# Patient Record
Sex: Female | Born: 1965 | Race: White | Hispanic: No | Marital: Married | Smoking: Former smoker
Health system: Southern US, Community
[De-identification: ages and names within clinical notes are randomized; demographics above are authoritative.]

## PROBLEM LIST (undated history)

## (undated) ENCOUNTER — Ambulatory Visit: Admission: EM | Payer: 59 | Source: Home / Self Care

## (undated) DIAGNOSIS — T7840XA Allergy, unspecified, initial encounter: Secondary | ICD-10-CM

## (undated) DIAGNOSIS — E0590 Thyrotoxicosis, unspecified without thyrotoxic crisis or storm: Secondary | ICD-10-CM

## (undated) DIAGNOSIS — E079 Disorder of thyroid, unspecified: Secondary | ICD-10-CM

## (undated) DIAGNOSIS — R079 Chest pain, unspecified: Secondary | ICD-10-CM

## (undated) DIAGNOSIS — M1990 Unspecified osteoarthritis, unspecified site: Secondary | ICD-10-CM

## (undated) DIAGNOSIS — G90A Postural orthostatic tachycardia syndrome (POTS): Secondary | ICD-10-CM

## (undated) DIAGNOSIS — Z720 Tobacco use: Secondary | ICD-10-CM

## (undated) DIAGNOSIS — Z973 Presence of spectacles and contact lenses: Secondary | ICD-10-CM

## (undated) DIAGNOSIS — R609 Edema, unspecified: Secondary | ICD-10-CM

## (undated) DIAGNOSIS — R000 Tachycardia, unspecified: Secondary | ICD-10-CM

## (undated) DIAGNOSIS — I498 Other specified cardiac arrhythmias: Secondary | ICD-10-CM

## (undated) DIAGNOSIS — I951 Orthostatic hypotension: Secondary | ICD-10-CM

## (undated) DIAGNOSIS — E785 Hyperlipidemia, unspecified: Secondary | ICD-10-CM

## (undated) HISTORY — PX: KNEE ARTHROPLASTY: SHX992

## (undated) HISTORY — PX: THYROID SURGERY: SHX805

## (undated) HISTORY — DX: Unspecified osteoarthritis, unspecified site: M19.90

## (undated) HISTORY — DX: Allergy, unspecified, initial encounter: T78.40XA

## (undated) HISTORY — DX: Tobacco use: Z72.0

## (undated) HISTORY — DX: Chest pain, unspecified: R07.9

## (undated) HISTORY — DX: Edema, unspecified: R60.9

## (undated) HISTORY — DX: Hyperlipidemia, unspecified: E78.5

---
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
## 2003-11-13 ENCOUNTER — Ambulatory Visit (HOSPITAL_COMMUNITY): Admission: RE | Admit: 2003-11-13 | Discharge: 2003-11-13 | Payer: Self-pay | Admitting: *Deleted

## 2004-10-15 HISTORY — PX: THYROIDECTOMY: SHX17

## 2005-06-08 ENCOUNTER — Ambulatory Visit: Payer: Self-pay | Admitting: Cardiology

## 2005-06-15 ENCOUNTER — Ambulatory Visit: Payer: Self-pay | Admitting: Cardiology

## 2005-10-15 HISTORY — PX: TILT TABLE STUDY: SHX6124

## 2006-02-20 ENCOUNTER — Encounter: Admission: RE | Admit: 2006-02-20 | Discharge: 2006-02-20 | Payer: Self-pay | Admitting: Neurology

## 2006-02-22 ENCOUNTER — Ambulatory Visit: Payer: Self-pay | Admitting: Internal Medicine

## 2006-03-07 ENCOUNTER — Ambulatory Visit (HOSPITAL_COMMUNITY): Admission: RE | Admit: 2006-03-07 | Discharge: 2006-03-07 | Payer: Self-pay | Admitting: Internal Medicine

## 2006-03-13 ENCOUNTER — Ambulatory Visit: Payer: Self-pay | Admitting: Internal Medicine

## 2006-04-12 ENCOUNTER — Ambulatory Visit: Payer: Self-pay | Admitting: Internal Medicine

## 2006-04-19 ENCOUNTER — Ambulatory Visit: Payer: Self-pay | Admitting: Internal Medicine

## 2006-04-29 ENCOUNTER — Ambulatory Visit: Payer: Self-pay | Admitting: Internal Medicine

## 2006-07-15 ENCOUNTER — Ambulatory Visit: Payer: Self-pay | Admitting: Internal Medicine

## 2006-09-12 ENCOUNTER — Ambulatory Visit: Payer: Self-pay | Admitting: Internal Medicine

## 2007-01-27 ENCOUNTER — Ambulatory Visit: Payer: Self-pay | Admitting: Internal Medicine

## 2007-03-27 ENCOUNTER — Ambulatory Visit: Payer: Self-pay | Admitting: Internal Medicine

## 2007-07-04 ENCOUNTER — Ambulatory Visit: Payer: Self-pay | Admitting: Internal Medicine

## 2008-02-06 ENCOUNTER — Ambulatory Visit: Payer: Self-pay | Admitting: Internal Medicine

## 2008-10-29 ENCOUNTER — Ambulatory Visit: Payer: Self-pay | Admitting: Internal Medicine

## 2008-10-29 DIAGNOSIS — I951 Orthostatic hypotension: Secondary | ICD-10-CM | POA: Insufficient documentation

## 2008-10-29 HISTORY — DX: Orthostatic hypotension: I95.1

## 2009-05-16 ENCOUNTER — Ambulatory Visit: Payer: Self-pay | Admitting: Internal Medicine

## 2009-11-08 ENCOUNTER — Encounter (INDEPENDENT_AMBULATORY_CARE_PROVIDER_SITE_OTHER): Payer: Self-pay | Admitting: *Deleted

## 2010-01-09 ENCOUNTER — Ambulatory Visit: Payer: Self-pay | Admitting: Internal Medicine

## 2010-02-21 ENCOUNTER — Ambulatory Visit (HOSPITAL_COMMUNITY): Admission: RE | Admit: 2010-02-21 | Discharge: 2010-02-21 | Payer: Self-pay | Admitting: Orthopaedic Surgery
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
## 2010-11-14 NOTE — Letter (Signed)
Summary: Appointment - Reminder 2  [facility], Main Office  [address]   [place], [place] [address]   Phone: [phone]  Fax: [phone]     November 08, 2009 MRN: [id number]   [name] [address] [place], [place]  [address]   Dear Ms. Leichter,  Our records indicate that it is time to schedule a follow-up appointment with Dr. Tenny Craw. It is very important that we reach you to schedule this appointment. We look forward to participating in your health care needs. Please contact us at the number listed above at your earliest convenience to schedule your appointment.  If you are unable to make an appointment at this time, give [place] a call so we can update our records.  Sincerely,   Migdalia Dk [facility] Scheduling Team

## 2011-02-27 NOTE — Assessment & Plan Note (Signed)
[facility]                       [place] CARDIOLOGY OFFICE NOTE   NAME:[name], [name]                         MRN:          [id number]  DATE:07/04/2007                            DOB:          05-05-1966    IDENTIFICATION:  [name] is a 45 year old woman with a history of POTS.  I last saw her back in June.   Since seen, she has done well. She is off Florinef. She remains only on  Metoprolol 25 mg daily and Wellbutrin 1 time per day (she dropped down  from b.i.d. in early September). She denies dizziness. She is active.  There is no chest pressure.   CURRENT MEDICATIONS:  1. Wellbutrin 150 mg daily.  2. Toprol XL 25 mg daily (question Metoprolol).   PHYSICAL EXAMINATION:  GENERAL:  The patient is in no distress.  VITAL SIGNS:  Blood pressure 108/70, pulse 68 and regular, weight 155  down from 163 on last visit.  LUNGS:  Clear.  NECK:  JVP is normal.  CARDIAC:  Regular rate and rhythm, S1, S2, no S3, no murmurs or clicks.  ABDOMEN:  Benign.  EXTREMITIES:  No edema.   LABORATORY DATA:  When I last saw her in June: Total cholesterol 117,  triglycerides 40, LDL 66, HDL 43.   IMPRESSION:  Dysautonomia. The patient is doing very well on a tapering  phase from her medicines. I am reluctant and I think that right now that  I would leave her on the Metoprolol and the Wellbutrin. I would not  taper any faster. I would like to see her back in the spring.   She is interested in starting exercise and I think that this is good.  Again, take activities as tolerated, increasing slowly, and drink plenty  of fluids. I will set to see her back again as noted in the spring,  sooner if problems develop.     Pricilla Riffle, MD, [facility]  Electronically Signed    PVR/MedQ  DD: 07/04/2007  DT: 07/04/2007  Job #: [id number]   cc:   Samuel Jester

## 2011-02-27 NOTE — Assessment & Plan Note (Signed)
[facility]                       [place] CARDIOLOGY OFFICE NOTE   NAME:Sand, [name]                         MRN:          [id number]  DATE:03/27/2007                            DOB:          1965/11/18    IDENTIFICATION:  The patient is a 45 year old woman with a history of  POTTS. Last seen in cardiology clinic back in [place].   Since seen, she has cut back her Florinef to a quarter tablet daily and  continues on the Wellbutrin and the Toprol. She is doing well. She has  been active with work. She went to [employer] for three days  and has been out in the 100 degree heat. She admits that she is drinking  a lot of liquids.   She notes rare dizziness. Otherwise, doing well.   CURRENT MEDICATIONS:  1. Synthroid 0.1.  2. Wellbutrin XL 150 b.i.d.  3. Metoprolol 25 daily.  4. Florinef one-quarter of 0.1 mg daily.   PHYSICAL EXAMINATION:  The patient is in no distress. Blood pressure is  100/70, pulse 64, weight is 163.  LUNGS:  Clear.  CARDIAC: Regular rate and rhythm, S1, S2. No S3.  ABDOMEN: Benign.  EXTREMITIES: No edema.   Lipids pending. Note, LFT shows slight minimal decrease in her albumin  at 3.4, normal 3.5.   IMPRESSION:  POTTS, clinically improved. I told her to stop the Florinef  and if by September she is still feeling okay she can go back down on  the Wellbutrin to 150 daily. I would like to see her in December. I am  not eager to taper any faster as I am worried she will have some  rebound. Again, she seems to be recovering from this episode well.  Encouraged her to stay adequately hydrated.     Pricilla Riffle, MD, [facility]  Electronically Signed    PVR/MedQ  DD: 03/27/2007  DT: 03/27/2007  Job #: [id number]   cc:   Samuel Jester

## 2011-02-27 NOTE — Assessment & Plan Note (Signed)
[facility]                            CARDIOLOGY OFFICE NOTE   NAME:Markwardt, TRISTEN PENNINO                         MRN:          [id number]  DATE:10/29/2008                            DOB:          [birth year]    IDENTIFICATION:  Ms. Mis is a 45 year old woman with a history of POTS.  I saw her back in Jaquelyn 2009.   Since seen, she has done well.  She stays active.  She denies dizziness  occasionally at the end of a busy day.  She will see kind of spots in  her visual field, but they recover on her own.   CURRENT MEDICATIONS:  1. Wellbutrin SR 150 every other day.  2. Metoprolol tartrate 25 daily.  3. Synthroid 0.1.   PHYSICAL EXAMINATION:  GENERAL:  On exam, the patient is in no distress  at rest.  VITAL SIGNS:  Blood pressure 110/66, pulse 62 and regular, weight 170,  up from 165.  NECK:  No JVD.  LUNGS:  Clear.  CARDIAC:  Regular rate and rhythm, S1 and S2.  No S3, no murmurs, no  clicks.  ABDOMEN:  Benign.  EXTREMITIES:  No edema.   IMPRESSION:  1. Dysautonomia continues to improve.  We have discussed back in      fourth, I think, she could come off the Wellbutrin.  If she has any      problems, I would put her on lower dose 37.5 daily.  I would      continue the beta-blocker, encouraged her to stay active,      encouraged her to drink fluids.  I will set to see her in the      summer.   ADDENDUM:  A 12-lead EKG, normal sinus rhythm 61 beats per minute.     Pricilla Riffle, MD, [facility]  Electronically Signed    PVR/MedQ  DD: 10/29/2008  DT: 10/29/2008  Job #: [id number]

## 2011-02-27 NOTE — Assessment & Plan Note (Signed)
[facility]                       [place] CARDIOLOGY OFFICE NOTE   NAME:Saldarriaga, LAILANA SHIRA                         MRN:          [id number]  DATE:02/06/2008                            DOB:          [birth year]    IDENTIFICATION:  Ms. Caloca is a  45 year old woman with a history of  POTS.  I last saw her back in June.   In the interval she continues to do well.  She denies any palpitations.  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  She notes rare times where she feels like her heart might start going  fast, but it never happens.   CURRENT MEDICINES:  1. Include Synthroid 0.1.  2. Wellbutrin 150 daily.  3. Metoprolol XL 25 daily.   PHYSICAL EXAM:  The patient is in no distress.  Blood pressure is 116/72, pulse is 60, weight 165.  Her lungs are clear.  Cardiac exam, regular rate and rhythm, S1-S2 no S3.  ABDOMEN:  Benign.  EXTREMITIES:  No edema.   IMPRESSION:  Dysautonomia improved with really no symptoms.  I would go  ahead and back off on the Wellbutrin to every other day and then every 3  days, continue on the Toprol.  At some point will start tapering more.  I would like to see her back in the fall.  If she has any recurrence of  symptoms on the taper, of course, she should call and go back on what  she is on.  I would like to see her back in September.     Pricilla Riffle, MD, [facility]  Electronically Signed    PVR/MedQ  DD: 02/06/2008  DT: 02/06/2008  Job #: [id number]

## 2011-03-02 NOTE — Assessment & Plan Note (Signed)
[facility]                              CARDIOLOGY OFFICE NOTE   NAME:Pagel, ABIA MONACO                         MRN:          [id number]  DATE:04/29/2006                            DOB:          [birth year]    IDENTIFICATION:  Patient is a 45 year old woman with a history of POTS.  I  last saw her in clinic back in June on the 29th.   When I saw her last I increased her Florinef to 0.1 mg q. a.m.,  0.05 mg q.  p.m.   In the interval she says she is feeling better.  She only has occasional  episodes where she gets foggy in her eyes.  She has had no significant  spells though.  She is increasing her activity without problem.   CURRENT MEDICATIONS:  1.  Synthroid 0.1 mg q. day.  2.  Wellbutrin 150 b.i.d.  3.  Florinef 0.1 mg a.m., 0.05 mg q. p.m.  4.  Toprol 25 mg q. day.   PHYSICAL EXAMINATION:  GENERAL:  Patient is in no distress.  VITAL SIGNS:  Blood pressure is 123/83 laying.  Pulse 71.  Sitting 116/80.  Pulse 74.  Standing at one minute 129/80.  Pulse 81.  Standing at two  minutes 126/87.  Pulse 74.  Standing at five minutes 125/86.  Pulse 74.  LUNGS:  Clear.  CARDIAC:  Regular rate and rhythm.  S1, S2, no S3.  No murmurs.  ABDOMEN:  Benign.   IMPRESSION:  1.  Dysautonomia .  Has improved on the current regimen of Florinef,      Wellbutrin and Toprol.  Note recent potassium was within normal limits.      Checked the week after increased dose of Florinef.  2.  I told the patient to increase activities as tolerated.  She would like      to try work, I told her to do this slowly, otherwise again adequate      fluid intake.  3.  I will see her back in two months time.                                Pricilla Riffle, MD, [facility]    PVR/MedQ  DD:  04/29/2006  DT:  04/30/2006  Job #:  [id number]   cc:   Genene Churn. Love, MD

## 2011-03-02 NOTE — Assessment & Plan Note (Signed)
[facility]                            CARDIOLOGY OFFICE NOTE   NAME:Audrey, HARIKA Boyd                         MRN:          [id number]  DATE:01/27/2007                            DOB:          [birth year]    IDENTIFICATION:  Ms. [name] is a 45 year old woman with a history of POTS.  She was last seen in clinic back in November.   In the interval, she says she continues to do better.  She is working  and notes only real dizziness when she sits down.  She is drinking a lot  of caffeine, she says a lot of [employer].  She denies palpitations.   CURRENT MEDICATIONS:  1. Synthroid 0.1 daily.  2. Wellbutrin 150 b.i.d.  3. Metoprolol 25 daily.  4. Florinef 0.05 daily.   PHYSICAL EXAMINATION:  GENERAL:  The patient is in no distress.  VITAL SIGNS:  Blood pressure is laying 120/75, pulse 68; sitting 126/85,  pulse 68; standing 114/78, pulse 78; standing at 2 minutes, 126/80,  pulse 84; standing at 5 minutes, 110/76, pulse 76.  The patient got a  little dizzy after sitting down.  LUNGS:  Clear.  CARDIAC:  Regular rate and rhythm, S1, S2, no S3.  ABDOMEN:  Benign.  EXTREMITIES:  No edema.   IMPRESSION/PLAN:  Akshita is a 45 year old with postural tachycardia  syndrome (POTS) and doing better.  She still has some increase in her  heart rate.  I told her though to try and back down the Florinef to a  1/4 tablet daily.  Continue on the other medicines.  She should do this  x2 months.  I think for now though, I would even keep her on it and I  will see her back in August.  If she has any problems, call sooner.  Again, I talked to her and told her to cut back on the caffeine intake  as this is a diuretic.  Encouraged her to maintain salt intake.     Pricilla Riffle, MD, [facility]  Electronically Signed    PVR/MedQ  DD: 01/27/2007  DT: 01/28/2007  Job #: [id number]

## 2011-03-02 NOTE — Assessment & Plan Note (Signed)
[facility]                            CARDIOLOGY OFFICE NOTE   NAME:Akers, ELLIA KNOWLTON                         MRN:          [id number]  DATE:09/12/2006                            DOB:          04-04-1966    IDENTIFICATION:  Mrs. Caul is a 45 year old woman with history of POTTS  LSR back in early October.   When I saw her last, I told her to back down on her Florinef to a half  daily.  She has done this.  She is also on the Toprol-XL 25 mg daily as  well as Wellbutrin 150 mg b.i.d.   She notes she is feeling well.  She is back to work some.  She denies  significant dizziness.  She is suffering from URI at present and notes  yellow sputum.   CURRENT MEDICATIONS:  1. Synthroid 0.1 daily.  2. Wellbutrin 150 mg b.i.d.  3. Metoprolol 25 mg daily.  4. Nasonex.  5. Florinef 0.5 daily.   PHYSICAL EXAMINATION:  GENERAL:  The patient is in no acute distress.  VITAL SIGNS:  Blood pressure lying 107/70, pulse 81; sitting 106/74,  pulse 80; standing at 0 minutes 106/74, pulse 92; at two minutes 105/77,  pulse 94; at five minutes, 112/76, pulse 95.  LUNGS:  Clear.  No rales or wheezes.  CARDIAC:  Regular rate and rhythm.  S1 and S2.  No S3.  No murmurs.  ABDOMEN:  Benign.  Obese.  EXTREMITIES:  No edema.   IMPRESSION:  1. Autonomic dysfunction.  Doing better.  Actually no evidence of      orthostasis on exam.  I would though continue him on the current      regimen for now. I am reluctant to back her off since she is just      now beginning to feel well.  Will reassess in several months.  2. Upper respiratory infection.  I  have given her a prescription for      a Z-pack.  With her autonomic dysfunction, if things get worse, she      would not tolerate well.     Pricilla Riffle, MD, [facility]  Electronically Signed    PVR/MedQ  DD: 09/12/2006  DT: 09/13/2006  Job #: [id number]

## 2011-03-02 NOTE — Op Note (Signed)
NAMEENES, WEGENER NO.:  [id number]   MEDICAL RECORD NO.:  [id number]          PATIENT TYPE:  OIB   LOCATION:  2855                         FACILITY:  [facility]   PHYSICIAN:  Duke Salvia, M.D.  DATE OF BIRTH:  1966-08-27   DATE OF PROCEDURE:  DATE OF DISCHARGE:  03/07/2006                                 OPERATIVE REPORT   PREOPERATIVE DIAGNOSIS:  Spells.   POSTOPERATIVE DIAGNOSIS:  Inconclusive tilt table test.   PROCEDURE:  Tilt table testing.   On obtaining informed consent, the patient was brought to the  electrophysiology laboratory, placed on a tilting table in __________ supine  position.  The patient was then tilted up at 70 degrees.  There was a  gradual decrease in blood pressure from a baseline of 140 or so/85 to  113s/82, at which time the patient complained of passing out, and she was  returned to the supine position.  Repeat blood pressure was not obtained.  The heart rate had climbed gradually from the resting rate of 78 into the  mid 80s and with her complaints fell to the mid 50s and 60s.   IMPRESSION:  1.  Modest orthostasis with superimposition of a bradycardic reaction,      possibly cardioinhibitory.           ______________________________  Duke Salvia, M.D.     SCK/MEDQ  D:  04/19/2006  T:  04/19/2006  Job:  [id number]

## 2011-03-02 NOTE — Discharge Summary (Signed)
NAMECANDI, PROFIT NO.:  [id number]   MEDICAL RECORD NO.:  [id number]           PATIENT TYPE:   LOCATION:                                 FACILITY:   PHYSICIAN:  Maple Mirza, P.A.      DATE OF BIRTH:   DATE OF ADMISSION:  02/23/2006  DATE OF DISCHARGE:  03/07/2006                                 DISCHARGE SUMMARY   ALLERGIES:  1.  PENICILLIN.  2.  LATEX.   PRINCIPAL DIAGNOSES:  1.  Positive tilt study.      1.  Symptomatic 17 minutes after tilt to 70 degrees.      2.  Presyncopal at heart rate of 64.  Blood pressure unable to be taken,          but 1 minute prior it was 113/82, down from 143/85 at the beginning          of the study.  2.  Palpitations/dizziness.  Her initial presentation was March 2006 with      hypertension.  3.  At tilt, my symptoms were exactly reproduced per patient.  4.  The patient had palpitations which were better on Toprol/__________ on      Pindolol.  5.  Consistent headache, on Florinef.  6.  Extensive neurologic work-up in the past.   SECONDARY DIAGNOSIS:  Status post a thyroidectomy for goiter and hot thyroid  nodule.   BRIEF HISTORY:  Ms. Maiorana is a 45 year old female who has been having spells  of palpitation and dizziness/presyncope.  Her first spell was back in March  of 2006.  She became dizzy, she felt he heart racing, she was taken to  [facility] by EMS.  Her blood pressure at that time was 200/180.  The  patient continues to have problems with dizziness and palpitations.  She was  placed on Toprol some months ago by Dr. Charm Barges and says her heart rate has  done better as far as palpitations somewhat racing.   Overall, she has episodes of dizziness that are worse in the evening when  she is tired.  She has some at work, and she has to be careful not to  actually pass out.  She has not had any frank syncope, and her blood  pressure apparently is labile.  She apparently gets dizzy if she stands for  prolonged periods.  Her palpitations last as long as 30 minutes to 1 hour.  Apparently, she wore a monitor, but had inconclusive results.  With  palpitations, she feels her heart either racing or beating very hard; she  cannot distinguish which that is.  Apparently, this is mostly at night.  She  also has alternating chills and hot flashes, and then she becomes over  dizzy, even when sitting down.  The patient reports having a headache after  being started on Florinef and Pindolol.  Dr. Graciela Husbands says that Florinef could  cause headache, and the patient could discontinue that if she wished as this  time.   The patient says that her symptoms do not coincide in any way  with her  menses.  There is some thought that maybe she could benefit from a cortisol  level and perhaps a more thorough endocrine work-up.   However, to bring things back into focus, the patient's tilt study today,  Mar 07, 2006, was abnormal.  It was positive, and she will follow up with  Dr. Tenny Craw for this.  Dr. Tenny Craw saw her in the office on Feb 22, 2006, and her  last  sentence holds some hope out to the patient.  It says that she will follow  up with Dr. Tenny Craw in a few weeks to see how she responds to pindolol and  Florinef and where to go from there.  Again, there are other medicines to  consider for heart rate stabilization if these [place].      Maple Mirza, P.A.     GM/MEDQ  D:  03/07/2006  T:  03/07/2006  Job:  [id number]   cc:   Pricilla Riffle, M.D.  [address]  [address]  [place]  [place] [address]   Genene Churn. Love, M.D.  Fax: [phone]

## 2011-03-02 NOTE — Assessment & Plan Note (Signed)
[facility]                              CARDIOLOGY OFFICE NOTE   NAME:Rimmer, SHERROL VICARS                         MRN:          [id number]  DATE:07/15/2006                            DOB:          07-Dec-1965    IDENTIFICATION:  The patient is a 45 year old with a history of POTS.  She  was last seen in clinic back in July.   Since seen, she actually has noted feeling a little foggier, feeling her  vision is just a little off, has not had any syncopal spells, has some  dizziness thinks it might be a little bit better.   CURRENT MEDICATIONS:  1. Wellbutrin XL 150 b.i.d.  2. Florinef 0.1 mg q.a.m., 0.05 mg q.p.m.  3. Toprol XL 25 every day.  4. Synthroid 0.1 every day.   PHYSICAL EXAMINATION:  GENERAL:  The patient is in no distress.  VITAL SIGNS:  Blood pressure, laying 137/86 and pulse 58; sitting 131/90 and  pulse 59; standing 137/89 and pulse 64, at two minutes 150/93 and pulse 66;  five minutes 148/and 101 pulse 68.  LUNGS:  Clear.  CARDIAC:  Regular rate rhythm.  Normal S1 S2.  No extra heart sounds.  ABDOMEN:  Benign.  EXTREMITIES:  No edema.  HEENT:  Ophthalmologic exam:  Disks are flat.  Normal vessels.   IMPRESSION:  Symptomatically, Ms. Sias is feeling a little foggier with  visual symptoms.  On exam today, she is a little hypertensive.   I have asked her to back down on her Florinef to 0.05 q.a.m., hold  tomorrow's dose.  I would like to see her back in a few months to see how  she is doing with this.  Again, encouraged her to stay active.  If her  symptoms worsen, of course contact us sooner.            ______________________________  Pricilla Riffle, MD, [facility]     PVR/MedQ  DD:  07/15/2006  DT:  07/16/2006  Job #:  [id number]

## 2012-05-28 ENCOUNTER — Emergency Department (HOSPITAL_COMMUNITY)
Admission: EM | Admit: 2012-05-28 | Discharge: 2012-05-28 | Disposition: A | Discharge status: Home / Self Care | Payer: Managed Care, Other (non HMO) | Attending: Emergency Medicine | Admitting: Emergency Medicine

## 2012-05-28 ENCOUNTER — Emergency Department (HOSPITAL_COMMUNITY): Payer: Managed Care, Other (non HMO)

## 2012-05-28 ENCOUNTER — Encounter (HOSPITAL_COMMUNITY): Payer: Self-pay | Admitting: Emergency Medicine

## 2012-05-28 DIAGNOSIS — E079 Disorder of thyroid, unspecified: Secondary | ICD-10-CM | POA: Insufficient documentation

## 2012-05-28 DIAGNOSIS — W230XXA Caught, crushed, jammed, or pinched between moving objects, initial encounter: Secondary | ICD-10-CM | POA: Insufficient documentation

## 2012-05-28 DIAGNOSIS — Z880 Allergy status to penicillin: Secondary | ICD-10-CM | POA: Insufficient documentation

## 2012-05-28 DIAGNOSIS — S61209A Unspecified open wound of unspecified finger without damage to nail, initial encounter: Secondary | ICD-10-CM | POA: Insufficient documentation

## 2012-05-28 DIAGNOSIS — Z23 Encounter for immunization: Secondary | ICD-10-CM | POA: Insufficient documentation

## 2012-05-28 DIAGNOSIS — S61219A Laceration without foreign body of unspecified finger without damage to nail, initial encounter: Secondary | ICD-10-CM

## 2012-05-28 HISTORY — DX: Disorder of thyroid, unspecified: E07.9

## 2012-05-28 MED ORDER — LIDOCAINE HCL (PF) 1 % IJ SOLN
INTRAMUSCULAR | Status: AC
  Administered 2012-05-28: 19:00:00
  Filled 2012-05-28: qty 5

## 2012-05-28 MED ORDER — SULFAMETHOXAZOLE-TRIMETHOPRIM 800-160 MG PO TABS: 1.0000 | ORAL_TABLET | Freq: Two times a day (BID) | ORAL | Status: AC

## 2012-05-28 MED ORDER — TETANUS-DIPHTH-ACELL PERTUSSIS 5-2.5-18.5 LF-MCG/0.5 IM SUSP
0.5000 mL | Freq: Once | INTRAMUSCULAR | Status: AC
  Administered 2012-05-28: 0.5 mL via INTRAMUSCULAR
  Filled 2012-05-28: qty 0.5
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
## 2012-06-01 NOTE — ED Provider Notes (Signed)
History     CSN: [id number]  Arrival date & time 05/28/12  1741   First MD Initiated Contact with Patient 05/28/12 1751      Chief Complaint  Patient presents with  . Finger Injury    (Consider location/radiation/quality/duration/timing/severity/associated sxs/prior treatment) HPI Comments: [name] presents with a laceration to her left distal index finger when she sustained a crush injury when a trailer rolled off its blocks and caught her finger between the hitch and a vehicle.  The injury occurred just prior to arrival.  She has constant pain in the finger without radiation.  She can move and bend the finger, but with discomfort.  She has applied pressure to the wound and has obtained hemostasis.  She is unsure of her last tetanus update.    The history is provided by the patient.    Past Medical History  Diagnosis Date  . Potts disease   . Thyroid disease     Past Surgical History  Procedure Date  . Thyroid surgery   . Knee arthroplasty     History reviewed. No pertinent family history.  History  Substance Use Topics  . Smoking status: Former Games developer  . Smokeless tobacco: Not on file  . Alcohol Use: No    OB History    Grav Para Term Preterm Abortions TAB SAB Ect Mult Living                  Review of Systems  Constitutional: Negative for fever and chills.  HENT: Negative for facial swelling.   Respiratory: Negative for shortness of breath and wheezing.   Skin: Positive for wound.  Neurological: Negative for numbness.    Allergies  Penicillins and Latex  Home Medications   Current Outpatient Rx  Name Route Sig Dispense Refill  . LEVOTHYROXINE SODIUM 100 MCG PO TABS Oral Take 100 mcg by mouth every morning.    . SULFAMETHOXAZOLE-TRIMETHOPRIM 800-160 MG PO TABS Oral Take 1 tablet by mouth 2 (two) times daily. 14 tablet 0    BP 131/92  Pulse 100  Temp 98.1 F (36.7 C) (Oral)  Resp 14  Ht 5\' 5"  (1.651 m)  Wt 163 lb (73.936 kg)  BMI 27.12  kg/m2  SpO2 99%  LMP 05/03/2012  Physical Exam  Constitutional: She is oriented to person, place, and time. She appears well-developed and well-nourished.  HENT:  Head: Normocephalic.  Cardiovascular: Normal rate.   Pulmonary/Chest: Effort normal.  Musculoskeletal: She exhibits tenderness.       Left hand: She exhibits tenderness. She exhibits normal range of motion, normal two-point discrimination and normal capillary refill. normal sensation noted.       Hands: Neurological: She is alert and oriented to person, place, and time. No sensory deficit.  Skin: Laceration noted.       1 cm flap laceration distal lateral left index finger, hemostatic.    ED Course  Procedures (including critical care time)  Labs Reviewed - No data to display No results found.   1. Laceration of finger    LACERATION REPAIR Performed by: Burgess Amor Authorized by: Burgess Amor Consent: Verbal consent obtained. Risks and benefits: risks, benefits and alternatives were discussed Consent given by: patient Patient identity confirmed: provided demographic data Prepped and Draped in normal sterile fashion Wound explored  Laceration Location: left index finger  Laceration Length: 1cm  No Foreign Bodies seen or palpated  Anesthesia: digital block Local anesthetic: lidocaine 1% without epinephrine  Anesthetic total:2 ml  Irrigation  method: syringe Amount of cleaning: copious Skin closure: sterile strips Number of sutures: na  Technique: sterile strips  Patient tolerance: Patient tolerated the procedure well with no immediate complications.  Dressing applied to finger.  MDM  xrays reviewed and discussed with pt.  Tetanus updated.  Wound care instructions given. Return for any sign of infection.  Wound was contaminated,  Placed on bactrim.  Pt deferred pain med.        Burgess Amor, [place] 06/01/12 2349

## 2012-06-02 NOTE — ED Provider Notes (Signed)
Medical screening examination/treatment/procedure(s) were performed by non-physician practitioner and as supervising physician I was immediately available for consultation/collaboration. Devoria Albe, MD, FACEP   Ward Givens, MD 06/02/12 [phone]

## 2014-05-22 ENCOUNTER — Encounter (HOSPITAL_COMMUNITY): Payer: Self-pay | Admitting: Emergency Medicine

## 2014-05-22 ENCOUNTER — Emergency Department (HOSPITAL_COMMUNITY)
Admission: EM | Admit: 2014-05-22 | Discharge: 2014-05-22 | Disposition: A | Discharge status: Home / Self Care | Payer: BC Managed Care – PPO | Attending: Emergency Medicine | Admitting: Emergency Medicine

## 2014-05-22 DIAGNOSIS — Z91040 Latex allergy status: Secondary | ICD-10-CM | POA: Insufficient documentation

## 2014-05-22 DIAGNOSIS — T7840XA Allergy, unspecified, initial encounter: Secondary | ICD-10-CM

## 2014-05-22 DIAGNOSIS — Z8739 Personal history of other diseases of the musculoskeletal system and connective tissue: Secondary | ICD-10-CM | POA: Insufficient documentation

## 2014-05-22 DIAGNOSIS — R21 Rash and other nonspecific skin eruption: Secondary | ICD-10-CM | POA: Insufficient documentation

## 2014-05-22 DIAGNOSIS — Z79899 Other long term (current) drug therapy: Secondary | ICD-10-CM | POA: Insufficient documentation

## 2014-05-22 DIAGNOSIS — T4995XA Adverse effect of unspecified topical agent, initial encounter: Secondary | ICD-10-CM | POA: Insufficient documentation

## 2014-05-22 DIAGNOSIS — IMO0002 Reserved for concepts with insufficient information to code with codable children: Secondary | ICD-10-CM | POA: Insufficient documentation

## 2014-05-22 DIAGNOSIS — Z87891 Personal history of nicotine dependence: Secondary | ICD-10-CM | POA: Insufficient documentation

## 2014-05-22 DIAGNOSIS — H5789 Other specified disorders of eye and adnexa: Secondary | ICD-10-CM | POA: Insufficient documentation

## 2014-05-22 DIAGNOSIS — E079 Disorder of thyroid, unspecified: Secondary | ICD-10-CM | POA: Insufficient documentation

## 2014-05-22 DIAGNOSIS — Z880 Allergy status to penicillin: Secondary | ICD-10-CM | POA: Insufficient documentation

## 2014-05-22 MED ORDER — METHYLPREDNISOLONE SODIUM SUCC 125 MG IJ SOLR
125.0000 mg | Freq: Once | INTRAMUSCULAR | Status: AC
  Administered 2014-05-22: 125 mg via INTRAVENOUS
  Filled 2014-05-22: qty 2

## 2014-05-22 MED ORDER — PREDNISONE 50 MG PO TABS
ORAL_TABLET | ORAL | Status: DC
Start: 2014-05-22 — End: 2014-09-28

## 2014-05-22 MED ORDER — LOTEPREDNOL ETABONATE 0.5 % OP SUSP: 2.0000 [drp] | Freq: Three times a day (TID) | OPHTHALMIC | Status: DC

## 2014-05-22 MED ORDER — SODIUM CHLORIDE 0.9 % IV BOLUS (SEPSIS)
500.0000 mL | Freq: Once | INTRAVENOUS | Status: AC
  Administered 2014-05-22: 500 mL via INTRAVENOUS

## 2014-05-22 MED ORDER — FAMOTIDINE IN NACL 20-0.9 MG/50ML-% IV SOLN
20.0000 mg | Freq: Once | INTRAVENOUS | Status: AC
  Administered 2014-05-22: 20 mg via INTRAVENOUS
  Filled 2014-05-22: qty 50

## 2014-05-22 MED ORDER — DIPHENHYDRAMINE HCL 50 MG/ML IJ SOLN
25.0000 mg | Freq: Once | INTRAMUSCULAR | Status: AC
  Administered 2014-05-22: 25 mg via INTRAVENOUS
  Filled 2014-05-22: qty 1

## 2014-05-22 NOTE — ED Provider Notes (Signed)
CSN: [id number]     Arrival date & time 05/22/14  4403 History  This chart was scribed for Nat Christen, MD by Ludger Nutting, ED Scribe. This patient was seen in room APA02/APA02 and the patient's care was started 10:41 AM.    Chief Complaint  Patient presents with  . Eye Problem    The history is provided by the patient. No language interpreter was used.    HPI Comments: Britton Turner is a 48 y.o. female who presents to the Emergency Department complaining of several hours of bilateral eye redness and swelling that is worse in the right eye. Patient also has a constant, gradually worsened rash to the face, neck, and bilateral arms that began a few days ago. 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 She reports having a history of multiple allergies and believes this to be related to this particular chemical. She denies oral swelling, difficulty swallowing, dyspnea.   Past Medical History  Diagnosis Date  . Potts disease   . Thyroid disease    Past Surgical History  Procedure Laterality Date  . Thyroid surgery    . Knee arthroplasty     No family history on file. History  Substance Use Topics  . Smoking status: Former Research scientist (life sciences)  . Smokeless tobacco: Not on file  . Alcohol Use: No   OB History   Grav Para Term Preterm Abortions TAB SAB Ect Mult Living                 Review of Systems  A complete 10 system review of systems was obtained and all systems are negative except as noted in the HPI and PMH.    Allergies  Other; Penicillins; and Latex  Home Medications   Prior to Admission medications   Medication Sig Start Date End Date Taking? Authorizing Provider  betamethasone valerate (VALISONE) 0.1 % cream Apply 1 application topically daily.   Yes Historical Provider, MD  diphenhydrAMINE (BENADRYL) 25 MG tablet Take 25-50 mg by mouth every 8 (eight) hours as needed for itching or allergies.   Yes Historical Provider, MD   Ketotifen Fumarate (ALLERGY EYE DROPS OP) Apply 1 drop to eye daily as needed (itching).   Yes Historical Provider, MD  levothyroxine (SYNTHROID, LEVOTHROID) 100 MCG tablet Take 100 mcg by mouth every morning.   Yes Historical Provider, MD  loteprednol (LOTEMAX) 0.5 % ophthalmic suspension Place 2 drops into both eyes 3 (three) times daily. 05/22/14   Nat Christen, MD  predniSONE (DELTASONE) 50 MG tablet 1 tablet daily for 6 days 05/22/14   Nat Christen, MD   BP 108/76  Pulse 66  Temp(Src) 98.7 F (37.1 C) (Oral)  Resp 16  Ht 5\' 5"  (1.651 m)  Wt 163 lb (73.936 kg)  BMI 27.12 kg/m2  SpO2 100%  LMP 04/21/2014 Physical Exam  Nursing note and vitals reviewed. Constitutional: She is oriented to person, place, and time. She appears well-developed and well-nourished.  HENT:  Head: Normocephalic and atraumatic.  Eyes: Conjunctivae and EOM are normal. Pupils are equal, round, and reactive to light.  Neck: Normal range of motion. Neck supple.  Cardiovascular: Normal rate, regular rhythm and normal heart sounds.   Pulmonary/Chest: Effort normal and breath sounds normal.  Abdominal: Soft. Bowel sounds are normal.  Musculoskeletal: Normal range of motion.  Neurological: She is alert and oriented to person, place, and time.  Skin: Skin is warm and dry. There is  erythema.  Face, neck and bilateral medial aspects of upper extremity has erythema and wheals. Cheeks, forehead and bridge of nose are also affected.   Psychiatric: She has a normal mood and affect. Her behavior is normal.    ED Course  Procedures (including critical care time)  DIAGNOSTIC STUDIES: Oxygen Saturation is 100% on RA, normal by my interpretation.    COORDINATION OF CARE: 10:50 AM Will treat with IV benadryl, solumedrol, and Pepcid. Discussed treatment plan with pt at bedside and pt agreed to plan.  11:30 AM Upon recheck, patient states her symptoms are improving with the given medications.    Labs Review Labs Reviewed - No  data to display  Imaging Review No results found.   EKG Interpretation None      MDM   Final diagnoses:  Allergic reaction, initial encounter    Patient feels much better after IV Solu-Medrol, IV Pepcid, IV Benadryl. Discharge medications oral prednisone, Lotemax eye drops, Claritin  I personally performed the services described in this documentation, which was scribed in my presence. The recorded information has been reviewed and is accurate.   Nat Christen, MD 05/22/14 1224

## 2014-05-22 NOTE — Discharge Instructions (Signed)
Recommend Claritin daily.   Prednisone for several days.  Also prescription for steroid eyedrops.   Try to avoid precipitating events.

## 2014-05-22 NOTE — ED Notes (Signed)
Patient with no complaints at this time. Respirations even and unlabored. Skin warm/dry. Discharge instructions reviewed with patient at this time. Patient given opportunity to voice concerns/ask questions. IV removed per policy and band-aid applied to site. Patient discharged at this time and left Emergency Department with steady gait.  

## 2014-05-22 NOTE — ED Notes (Signed)
Patient c/o itching at site of ID band on right wrist. ID band removed and patient told to hold on to the band.

## 2014-05-22 NOTE — ED Notes (Signed)
Pt c/o waking up this am with bilateral eye redness, drainage, swelling to eyes and nose area, itching, pt denies any injury or exposures, states "I am allergic to a lot of things", did take benadryl prior to coming to er,

## 2014-09-28 ENCOUNTER — Encounter (HOSPITAL_BASED_OUTPATIENT_CLINIC_OR_DEPARTMENT_OTHER): Payer: Self-pay | Admitting: *Deleted

## 2014-09-28 NOTE — Progress Notes (Signed)
No labs has a condition see saw dr Gwenyth Ober until 2011 for bp goes up and down-POTTS

## 2014-09-30 ENCOUNTER — Encounter (HOSPITAL_BASED_OUTPATIENT_CLINIC_OR_DEPARTMENT_OTHER)
Admission: RE | Disposition: A | Discharge status: Home / Self Care | Payer: Self-pay | Source: Ambulatory Visit | Attending: Orthopedic Surgery

## 2014-09-30 ENCOUNTER — Encounter (HOSPITAL_BASED_OUTPATIENT_CLINIC_OR_DEPARTMENT_OTHER): Payer: Self-pay | Admitting: Orthopedic Surgery

## 2014-09-30 ENCOUNTER — Ambulatory Visit (HOSPITAL_BASED_OUTPATIENT_CLINIC_OR_DEPARTMENT_OTHER)
Admission: RE | Admit: 2014-09-30 | Discharge: 2014-09-30 | Disposition: A | Discharge status: Home / Self Care | Payer: BC Managed Care – PPO | Source: Ambulatory Visit | Attending: Orthopedic Surgery | Admitting: Orthopedic Surgery

## 2014-09-30 ENCOUNTER — Ambulatory Visit (HOSPITAL_BASED_OUTPATIENT_CLINIC_OR_DEPARTMENT_OTHER): Payer: BC Managed Care – PPO | Admitting: Anesthesiology

## 2014-09-30 DIAGNOSIS — Z888 Allergy status to other drugs, medicaments and biological substances status: Secondary | ICD-10-CM | POA: Insufficient documentation

## 2014-09-30 DIAGNOSIS — Z79899 Other long term (current) drug therapy: Secondary | ICD-10-CM | POA: Insufficient documentation

## 2014-09-30 DIAGNOSIS — M1811 Unilateral primary osteoarthritis of first carpometacarpal joint, right hand: Secondary | ICD-10-CM | POA: Diagnosis present

## 2014-09-30 DIAGNOSIS — E079 Disorder of thyroid, unspecified: Secondary | ICD-10-CM | POA: Insufficient documentation

## 2014-09-30 DIAGNOSIS — F17210 Nicotine dependence, cigarettes, uncomplicated: Secondary | ICD-10-CM | POA: Diagnosis not present

## 2014-09-30 DIAGNOSIS — D4989 Neoplasm of unspecified behavior of other specified sites: Secondary | ICD-10-CM | POA: Diagnosis not present

## 2014-09-30 DIAGNOSIS — Z880 Allergy status to penicillin: Secondary | ICD-10-CM | POA: Insufficient documentation

## 2014-09-30 DIAGNOSIS — M67441 Ganglion, right hand: Secondary | ICD-10-CM | POA: Diagnosis not present

## 2014-09-30 DIAGNOSIS — Z91048 Other nonmedicinal substance allergy status: Secondary | ICD-10-CM | POA: Insufficient documentation

## 2014-09-30 HISTORY — DX: Presence of spectacles and contact lenses: Z97.3

## 2014-09-30 HISTORY — DX: Thyrotoxicosis, unspecified without thyrotoxic crisis or storm: E05.90

## 2014-09-30 HISTORY — PX: MASS EXCISION: SHX2000

## 2014-09-30 LAB — POCT HEMOGLOBIN-HEMACUE: HEMOGLOBIN: 14.9 g/dL (ref 12.0–15.0)

## 2014-09-30 SURGERY — EXCISION MASS
Anesthesia: Monitor Anesthesia Care | Site: Finger | Laterality: Right

## 2014-09-30 MED ORDER — ONDANSETRON HCL 4 MG/2ML IJ SOLN
INTRAMUSCULAR | Status: DC | PRN
  Administered 2014-09-30: 4 mg via INTRAVENOUS

## 2014-09-30 MED ORDER — FENTANYL CITRATE 0.05 MG/ML IJ SOLN: 50.0000 ug | INTRAMUSCULAR | Status: DC | PRN

## 2014-09-30 MED ORDER — LACTATED RINGERS IV SOLN
INTRAVENOUS | Status: DC
  Administered 2014-09-30: 12:00:00 via INTRAVENOUS

## 2014-09-30 MED ORDER — HYDROCODONE-ACETAMINOPHEN 5-325 MG PO TABS: 1.0000 | ORAL_TABLET | Freq: Four times a day (QID) | ORAL | Status: DC | PRN

## 2014-09-30 MED ORDER — VANCOMYCIN HCL IN DEXTROSE 1-5 GM/200ML-% IV SOLN
1000.0000 mg | INTRAVENOUS | Status: AC
  Administered 2014-09-30: 1000 mg via INTRAVENOUS

## 2014-09-30 MED ORDER — VANCOMYCIN HCL IN DEXTROSE 500-5 MG/100ML-% IV SOLN
INTRAVENOUS | Status: AC
  Filled 2014-09-30: qty 100

## 2014-09-30 MED ORDER — MIDAZOLAM HCL 5 MG/5ML IJ SOLN
INTRAMUSCULAR | Status: DC | PRN
  Administered 2014-09-30: 1 mg via INTRAVENOUS

## 2014-09-30 MED ORDER — MIDAZOLAM HCL 2 MG/2ML IJ SOLN
1.0000 mg | INTRAMUSCULAR | Status: DC | PRN
Start: 2014-09-30 — End: 2014-09-30

## 2014-09-30 MED ORDER — FENTANYL CITRATE 0.05 MG/ML IJ SOLN
INTRAMUSCULAR | Status: AC
  Filled 2014-09-30: qty 2

## 2014-09-30 MED ORDER — MIDAZOLAM HCL 2 MG/2ML IJ SOLN
INTRAMUSCULAR | Status: AC
  Filled 2014-09-30: qty 2

## 2014-09-30 MED ORDER — FENTANYL CITRATE 0.05 MG/ML IJ SOLN
INTRAMUSCULAR | Status: AC
  Filled 2014-09-30: qty 4

## 2014-09-30 MED ORDER — FENTANYL CITRATE 0.05 MG/ML IJ SOLN
INTRAMUSCULAR | Status: DC | PRN
  Administered 2014-09-30: 50 ug via INTRAVENOUS

## 2014-09-30 MED ORDER — CHLORHEXIDINE GLUCONATE 4 % EX LIQD: 60.0000 mL | Freq: Once | CUTANEOUS | Status: DC

## 2014-09-30 MED ORDER — BUPIVACAINE HCL (PF) 0.25 % IJ SOLN
INTRAMUSCULAR | Status: DC | PRN
  Administered 2014-09-30: 5 mL

## 2014-09-30 MED ORDER — 0.9 % SODIUM CHLORIDE (POUR BTL) OPTIME
TOPICAL | Status: DC | PRN
  Administered 2014-09-30: 200 mL

## 2014-09-30 MED ORDER — PROPOFOL 10 MG/ML IV BOLUS
INTRAVENOUS | Status: AC
  Filled 2014-09-30: qty 20

## 2014-09-30 SURGICAL SUPPLY — 52 items
BANDAGE COBAN STERILE 2 (GAUZE/BANDAGES/DRESSINGS) IMPLANT
BLADE MINI RND TIP GREEN BEAV (BLADE) IMPLANT
BLADE SURG 15 STRL LF DISP TIS (BLADE) ×1 IMPLANT
BLADE SURG 15 STRL SS (BLADE) ×2
BNDG CMPR 9X4 STRL LF SNTH (GAUZE/BANDAGES/DRESSINGS)
BNDG COHESIVE 1X5 TAN STRL LF (GAUZE/BANDAGES/DRESSINGS) ×1 IMPLANT
BNDG COHESIVE 3X5 TAN STRL LF (GAUZE/BANDAGES/DRESSINGS) IMPLANT
BNDG ESMARK 4X9 LF (GAUZE/BANDAGES/DRESSINGS) IMPLANT
BNDG GAUZE ELAST 4 BULKY (GAUZE/BANDAGES/DRESSINGS) IMPLANT
CHLORAPREP W/TINT 26ML (MISCELLANEOUS) ×2 IMPLANT
CORDS BIPOLAR (ELECTRODE) ×2 IMPLANT
COVER BACK TABLE 60X90IN (DRAPES) ×2 IMPLANT
COVER MAYO STAND STRL (DRAPES) ×2 IMPLANT
CUFF TOURNIQUET SINGLE 18IN (TOURNIQUET CUFF) ×1 IMPLANT
DECANTER SPIKE VIAL GLASS SM (MISCELLANEOUS) IMPLANT
DRAIN PENROSE 1/2X12 LTX STRL (WOUND CARE) IMPLANT
DRAPE EXTREMITY T 121X128X90 (DRAPE) ×2 IMPLANT
DRAPE SURG 17X23 STRL (DRAPES) ×2 IMPLANT
GAUZE SPONGE 4X4 12PLY STRL (GAUZE/BANDAGES/DRESSINGS) ×2 IMPLANT
GAUZE XEROFORM 1X8 LF (GAUZE/BANDAGES/DRESSINGS) ×2 IMPLANT
GLOVE BIOGEL PI IND STRL 8.5 (GLOVE) ×1 IMPLANT
GLOVE BIOGEL PI INDICATOR 8.5 (GLOVE) ×1
GLOVE SURG ORTHO 8.0 STRL STRW (GLOVE) ×1 IMPLANT
GLOVE SURG SS PI 6.5 STRL IVOR (GLOVE) ×1 IMPLANT
GLOVE SURG SS PI 7.5 STRL IVOR (GLOVE) ×1 IMPLANT
GLOVE SURG SS PI 8.0 STRL IVOR (GLOVE) ×1 IMPLANT
GOWN STRL REUS W/ TWL LRG LVL3 (GOWN DISPOSABLE) ×1 IMPLANT
GOWN STRL REUS W/ TWL XL LVL3 (GOWN DISPOSABLE) IMPLANT
GOWN STRL REUS W/TWL LRG LVL3 (GOWN DISPOSABLE)
GOWN STRL REUS W/TWL XL LVL3 (GOWN DISPOSABLE) ×4 IMPLANT
NDL PRECISIONGLIDE 27X1.5 (NEEDLE) IMPLANT
NDL SAFETY ECLIPSE 18X1.5 (NEEDLE) IMPLANT
NEEDLE HYPO 18GX1.5 SHARP (NEEDLE)
NEEDLE PRECISIONGLIDE 27X1.5 (NEEDLE) IMPLANT
NS IRRIG 1000ML POUR BTL (IV SOLUTION) ×2 IMPLANT
PACK BASIN DAY SURGERY FS (CUSTOM PROCEDURE TRAY) ×2 IMPLANT
PAD CAST 3X4 CTTN HI CHSV (CAST SUPPLIES) IMPLANT
PADDING CAST ABS 3INX4YD NS (CAST SUPPLIES)
PADDING CAST ABS 4INX4YD NS (CAST SUPPLIES)
PADDING CAST ABS COTTON 3X4 (CAST SUPPLIES) IMPLANT
PADDING CAST ABS COTTON 4X4 ST (CAST SUPPLIES) ×1 IMPLANT
PADDING CAST COTTON 3X4 STRL (CAST SUPPLIES)
SPLINT FINGER 3.25 BULB 911905 (SOFTGOODS) ×1 IMPLANT
SPLINT PLASTER CAST XFAST 3X15 (CAST SUPPLIES) IMPLANT
SPLINT PLASTER XTRA FASTSET 3X (CAST SUPPLIES)
STOCKINETTE 4X48 STRL (DRAPES) ×2 IMPLANT
SUT ETHILON 5 0 PC 1 (SUTURE) ×1 IMPLANT
SUT VIC AB 4-0 P2 18 (SUTURE) IMPLANT
SYR BULB 3OZ (MISCELLANEOUS) ×2 IMPLANT
SYR CONTROL 10ML LL (SYRINGE) ×1 IMPLANT
TOWEL OR 17X24 6PK STRL BLUE (TOWEL DISPOSABLE) ×2 IMPLANT
UNDERPAD 30X30 INCONTINENT (UNDERPADS AND DIAPERS) ×2 IMPLANT

## 2014-09-30 NOTE — Brief Op Note (Signed)
09/30/2014  1:17 PM  PATIENT:  Leandrea Turner  48 y.o. 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  POST-OPERATIVE DIAGNOSIS:  Right Thumb mucoid Tumor  PROCEDURE:  Procedure(s): 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 (Right)  SURGEON:  Surgeon(s) and Role:    * Daryll Brod, MD - Primary  PHYSICIAN ASSISTANT:   ASSISTANTS: none   ANESTHESIA:   local and regional  EBL:  Total I/O In: 700 [I.V.:700] Out: -   BLOOD ADMINISTERED:none  DRAINS: none   LOCAL MEDICATIONS USED:  BUPIVICAINE   SPECIMEN:  Excision  DISPOSITION OF SPECIMEN:  PATHOLOGY  COUNTS:  YES  TOURNIQUET:   Total Tourniquet Time Documented: Forearm (Right) - 21 minutes Total: Forearm (Right) - 21 minutes   DICTATION: .Other Dictation: Dictation Number [id number]  PLAN OF CARE: Discharge to home after PACU  PATIENT DISPOSITION:  PACU - hemodynamically stable.

## 2014-09-30 NOTE — Discharge Instructions (Addendum)

## 2014-09-30 NOTE — Anesthesia Preprocedure Evaluation (Addendum)
Anesthesia Evaluation  Patient identified by MRN, date of birth, ID band Patient awake    Reviewed: Allergy & Precautions, H&P , NPO status , Patient's Chart, lab work & pertinent test results  Airway Mallampati: I  TM Distance: >3 FB Neck ROM: Full    Dental   Pulmonary Current Smoker,          Cardiovascular negative cardio ROS      Neuro/Psych negative neurological ROS  negative psych ROS   GI/Hepatic negative GI ROS, Neg liver ROS,   Endo/Other  Hypothyroidism   Renal/GU negative Renal ROS     Musculoskeletal   Abdominal   Peds  Hematology negative hematology ROS (+)   Anesthesia Other Findings   Reproductive/Obstetrics                           Anesthesia Physical Anesthesia Plan  ASA: II  Anesthesia Plan: Bier Block and MAC   Post-op Pain Management:    Induction: Intravenous  Airway Management Planned: Simple Face Mask  Additional Equipment:   Intra-op Plan:   Post-operative Plan:   Informed Consent: I have reviewed the patients History and Physical, chart, labs and discussed the procedure including the risks, benefits and alternatives for the proposed anesthesia with the patient or authorized representative who has indicated his/her understanding and acceptance.     Plan Discussed with: CRNA and Surgeon  Anesthesia Plan Comments:       Anesthesia Quick Evaluation

## 2014-09-30 NOTE — Transfer of Care (Signed)
Immediate Anesthesia Transfer of Care Note  Patient: [name]  Procedure(s) Performed: Procedure(s): 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 (Right)  Patient Location: PACU  Anesthesia Type:Bier block  Level of Consciousness: awake, alert  and oriented  Airway & Oxygen Therapy: Patient Spontanous Breathing  Post-op Assessment: Report given to PACU RN and Post -op Vital signs reviewed and stable  Post vital signs: Reviewed and stable  Complications: No apparent anesthesia complications

## 2014-09-30 NOTE — Op Note (Signed)
Dictation Number [id number]

## 2014-09-30 NOTE — Anesthesia Postprocedure Evaluation (Signed)
  Anesthesia Post-op Note  Patient: [name]  Procedure(s) Performed: Procedure(s): 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 (Right)  Patient Location: PACU  Anesthesia Type:Bier block  Level of Consciousness: awake and alert   Airway and Oxygen Therapy: Patient Spontanous Breathing  Post-op Pain: none  Post-op Assessment: Post-op Vital signs reviewed  Post-op Vital Signs: Reviewed  Last Vitals:  Filed Vitals:   09/30/14 1412  BP: 127/79  Pulse: 72  Temp: 36.4 C  Resp: 18    Complications: No apparent anesthesia complications

## 2014-09-30 NOTE — H&P (Signed)
[name] is a 48 year-old left-hand dominant female with a  mass on her right thumb IP joint for the past several months.  He recently aspirated a clear fluid from it. She recalls no history of injury. She complains of an intermittent, mild, dull aching type pain with a feeling of swelling.  She states it has not significantly changed. Work makes this worse.  Rest makes it better. She has not had any other treatment.  She also has a crush injury to her middle finger, middle phalanx.  She has history of thyroid problems.  No history of diabetes, arthritis or gout.   ALLERGIES:    None.  MEDICATIONS:     Synthroid.  SURGICAL HISTORY:    Knee surgery (right side) and thyroidectomy (2005).  FAMILY MEDICAL HISTORY:    Negative.  SOCIAL HISTORY:     She smokes 4-5 cigarettes a day and is advised to quit with the reasons behind this.  She does not drink. She is single and a Glass blower/designer.    REVIEW OF SYSTEMS:   Positive for glasses, otherwise negative 14 points. [name] is an 48 y.o. female.   Chief Complaint: mucoid tumor right thumb and DJD IP joint HPI: see above  Past Medical History  Diagnosis Date  . Thyroid disease   . Wears glasses   . Hyperthyroidism     Past Surgical History  Procedure Laterality Date  . Thyroid surgery    . Knee arthroplasty    . Tilt table study  2007  . Arthroscopic repair acl  1986    right knee  . Thyroidectomy  2006    History reviewed. No pertinent family history. Social History:  reports that she has been smoking.  She does not have any smokeless tobacco history on file. She reports that she does not drink alcohol or use illicit drugs.  Allergies:  Allergies  Allergen Reactions  . Epinephrine     jittery  . Other Other (See Comments)    Ink (Hives,Rash) Rubber ("big blisters that bust") Leather (Hives,Rash)  . Penicillins Other (See Comments)    REACTION: unknown/childhood  . Latex Rash    REACTION: Also allergic to all  rubbers, leathers with moisture and direct contact     No prescriptions prior to admission    No results found for this or any previous visit (from the past 48 hour(s)).  No results found.   Pertinent items are noted in HPI.  Height 5\' 5"  (1.651 m), weight 73.483 kg (162 lb), last menstrual period 09/26/2014.  General appearance: alert, cooperative and appears stated age Head: Normocephalic, without obvious abnormality Neck: no JVD Resp: clear to auscultation bilaterally Cardio: regular rate and rhythm, S1, S2 normal, no murmur, click, rub or gallop GI: soft, non-tender; bowel sounds normal; no masses,  no organomegaly Extremities: mass right thumb Pulses: 2+ and symmetric Skin: Skin color, texture, turgor normal. 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    DIAGNOSIS:     Mucoid tumor with degenerative arthritis, right thumb.  RECOMMENDATIONS/PLAN:    We have discussed possibility of surgical excision of this with her along with debridement of  the joint.  The pre, peri and postoperative course were discussed along with the risks and complications.  The patient is aware there is no guarantee with the surgery, possibility  of infection, recurrence, injury to arteries, nerves, tendons, incomplete relief of symptoms and dystrophy.  She is scheduled for excision mucoid cyst, debridement interphalangeal joint right thumb as an outpatient under regional anesthesia.  Ion Gonnella R 09/30/2014, 10:23 AM

## 2014-10-01 NOTE — Op Note (Signed)
NAMELASHARA, [name]:  [id number]  MEDICAL RECORD NO.:  [id number]  LOCATION:                                 FACILITY:  PHYSICIAN:  Daryll Brod, M.D.       DATE OF BIRTH:  1966-03-23  DATE OF PROCEDURE:  09/30/2014 DATE OF DISCHARGE:                              OPERATIVE REPORT   PREOPERATIVE DIAGNOSES:  Mucoid tumor, degenerative arthritis, interphalangeal joint, right thumb.  POSTOPERATIVE DIAGNOSES:  Mucoid tumor, degenerative arthritis, interphalangeal joint, right thumb.  OPERATION:  Excision of mucoid cyst with debridement of interphalangeal joint, right thumb.  SURGEON:  Daryll Brod, M.D.  ANESTHESIA:  Forearm IV regional with local infiltration.  ANESTHESIOLOGIST:  Suzette Battiest, MD.  HISTORY:  The patient is a 48 year old female with a history of a mass in dorsal aspect.  IP joint of her right thumb, this transilluminates. X-rays reveal mild degenerative changes of the distal interphalangeal joint, indicative of a mucoid tumor.  She has elected to have this excised along with debridement of the joint.  Pre, peri, and postoperative course have been discussed along with risks and complications.  She is aware that there is no guarantee with surgery; possibility of infection; recurrence of injury to arteries, nerves, tendons, incomplete relief of symptoms, dystrophy.  In the preoperative area, the patient is seen, the extremity marked by both the patient and surgeon.  Antibiotic given.  PROCEDURE IN DETAIL:  The patient was brought to the operating room, where forearm-based IV regional anesthetic was carried out without difficulty.  She was prepped using ChloraPrep in supine position with the right arm free.  A 3-minute dry time was allowed.  Time-out taken, confirming the patient and procedure.  The metacarpal block was given with 0.25% bupivacaine without epinephrine approximately 6 mL was used. A curvilinear incision was made over the  thumb, radial aspect, interphalangeal joint, carried down through subcutaneous.  Tissue bleeders were electrocauterized with bipolar.  A large lobulated cystic mass was immediately encountered at the IP joint.  This was excised and sent to Pathology.  The joint was then opened on its radial aspect.  A debridement performed with synovectomy with a small hemostatic rongeur. The wound was copiously irrigated with saline.  The skin then closed with interrupted 5-0 nylon sutures.  Sterile compressive dressing and splint to the thumb applied.  On deflation of the tourniquet, remaining fingers pinked.  She was taken to the recovery room for observation in satisfactory condition.  She will be discharged home to return to the [facility] in 1 week on Norco.          ______________________________ Daryll Brod, M.D.     GK/MEDQ  D:  09/30/2014  T:  10/01/2014  Job:  [id number]

## 2016-01-10 ENCOUNTER — Encounter (HOSPITAL_COMMUNITY): Payer: Self-pay | Admitting: Emergency Medicine

## 2016-01-10 ENCOUNTER — Emergency Department (HOSPITAL_COMMUNITY)
Admission: EM | Admit: 2016-01-10 | Discharge: 2016-01-10 | Disposition: A | Discharge status: Home / Self Care | Payer: BLUE CROSS/BLUE SHIELD | Attending: Emergency Medicine | Admitting: Emergency Medicine

## 2016-01-10 DIAGNOSIS — T7840XA Allergy, unspecified, initial encounter: Secondary | ICD-10-CM

## 2016-01-10 DIAGNOSIS — Z79899 Other long term (current) drug therapy: Secondary | ICD-10-CM | POA: Diagnosis not present

## 2016-01-10 DIAGNOSIS — R0602 Shortness of breath: Secondary | ICD-10-CM | POA: Insufficient documentation

## 2016-01-10 DIAGNOSIS — T7849XA Other allergy, initial encounter: Secondary | ICD-10-CM | POA: Diagnosis present

## 2016-01-10 MED ORDER — PREDNISONE 50 MG PO TABS
ORAL_TABLET | ORAL | Status: DC
Start: 2016-01-10 — End: 2016-07-06

## 2016-01-10 MED ORDER — PREDNISONE 50 MG PO TABS
60.0000 mg | ORAL_TABLET | Freq: Once | ORAL | Status: AC
  Administered 2016-01-10: 60 mg via ORAL
  Filled 2016-01-10: qty 1

## 2016-01-10 MED ORDER — EPINEPHRINE 0.3 MG/0.3ML IJ SOAJ: 0.3000 mg | Freq: Once | INTRAMUSCULAR | Status: DC

## 2016-01-10 NOTE — ED Provider Notes (Signed)
CSN: [id number]     Arrival date & time 01/10/16  [id number] History  By signing my name below, I, Stephania Fragmin, attest that this documentation has been prepared under the direction and in the presence of Ripley Fraise, MD. Electronically Signed: Stephania Fragmin, ED Scribe. 01/10/2016. 11:01 AM.   Chief Complaint  Patient presents with  . Allergic Reaction   Patient is a 50 y.o. female presenting with allergic reaction. The history is provided by the patient. No language interpreter was used.  Allergic Reaction Presenting symptoms: difficulty breathing, itching and rash   Presenting symptoms: no difficulty swallowing, no swelling and no wheezing   Difficulty breathing:    Severity:  Moderate   Onset quality:  Gradual   Duration:  3 hours   Timing:  Constant   Progression:  Resolved (after taking Benadryl) Itching:    Location:  Face and arm   Severity:  Moderate   Onset quality:  Gradual   Duration:  3 hours   Timing:  Constant   Progression:  Improving Severity:  Moderate Prior episodes: allergies to coolant. Context: chemicals   Relieved by:  Antihistamines (Benadryl) Worsened by:  Nothing tried Ineffective treatments:  None tried  HPI Comments: [name] is a 50 y.o. female with a history of hyperthyroidism, who presents to the Emergency Department complaining of an allergic reaction that occurred while at work today. Patient states she normally gets a facial rash due to allergies to a coolant in a machine at work (delivered as a fine mist), but today, she felt short of breath and near-syncopal due to her allergies, which she has never had before. She states she was immediately treated by a first responder team at work with Benadryl, which significantly alleviated her symptoms. She notes an associated facial rash and a rash on her BUE that have improved since treatment. She states yesterday, some of the coolant was splashed in her face. Patient states she has never used an Epi-pen for an  allergic reaction in the past. She states she has had to have a medication cocktail to reduce facial swelling in the past. Patient states she has seen a dermatologist her whole life, as she states, "I'm allergic to everything." She denies any tongue/lip swelling, trouble swallowing, syncope, fevers, vomiting, abdominal pain, or chest pain.   Past Medical History  Diagnosis Date  . Thyroid disease   . Wears glasses   . Hyperthyroidism    Past Surgical History  Procedure Laterality Date  . Thyroid surgery    . Knee arthroplasty    . Tilt table study  2007  . Arthroscopic repair acl  1986    right knee  . Thyroidectomy  2006  . 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 No pertinent family history. Social History  Substance Use Topics  . Smoking status: Current Every Day Smoker -- 0.50 packs/day  . Smokeless tobacco: None  . Alcohol Use: No     Comment: very rare   OB History    No data available     Review of Systems  Constitutional: Negative for fever.  HENT: Negative for trouble swallowing.   Respiratory: Positive for shortness of breath. Negative for wheezing.   Cardiovascular: Negative for chest pain.  Gastrointestinal: Negative for vomiting and abdominal pain.  Skin: Positive for itching and rash.  Neurological: Negative for syncope.  All other systems reviewed and are negative.    Allergies  Epinephrine; Other; Penicillins; and Latex  Home Medications   Prior to Admission medications   Medication Sig Start Date End Date Taking? Authorizing Provider  diphenhydrAMINE (BENADRYL) 25 MG tablet Take 25-50 mg by mouth every 8 (eight) hours as needed for itching or allergies.   Yes Historical Provider, MD  levothyroxine (SYNTHROID, LEVOTHROID) 100 MCG tablet Take 100 mcg by  mouth daily. 01/08/16  Yes Historical Provider, MD  triamcinolone cream (KENALOG) 0.5 % Apply 0.5 application topically 3 (three) times daily as needed. Arms face 12/02/15  Yes Historical Provider, MD  HYDROcodone-acetaminophen (NORCO) 5-325 MG per tablet Take 1 tablet by mouth every 6 (six) hours as needed for moderate pain. Patient not taking: Reported on 01/10/2016 09/30/14   Daryll Brod, MD   BP 140/89 mmHg  Pulse 78  Temp(Src) 98 F (36.7 C) (Oral)  Resp 15  Ht 5\' 5"  (1.651 m)  Wt 183 lb (83.008 kg)  BMI 30.45 kg/m2  SpO2 100%  LMP 09/26/2014 Physical Exam  Nursing note and vitals reviewed. CONSTITUTIONAL: Well developed/well nourished HEAD: Normocephalic/atraumatic EYES: EOMI/PERRL, mild right conjunctival erythema ENMT: Mucous membranes moist, no angioedema NECK: supple no meningeal signs SPINE/BACK:entire spine nontender CV: S1/S2 noted, no murmurs/rubs/gallops noted LUNGS: Lungs are clear to auscultation bilaterally, no apparent distress ABDOMEN: soft, nontender, no rebound or guarding, bowel sounds noted throughout abdomen GU:no cva tenderness NEURO: Pt is awake/alert/appropriate, moves all extremitiesx4.  No facial droop.   EXTREMITIES: pulses normal/equal, full ROM, mild erythematous rash to both arms SKIN: warm, color normal PSYCH: no abnormalities of mood noted, alert and oriented to situation   ED Course  Procedures  DIAGNOSTIC STUDIES: Oxygen Saturation is 100% on RA, normal by my interpretation.    COORDINATION OF CARE: 10:45 AM - Discussed treatment plan with pt at bedside which includes Benadryl prn and prednisone. Advised follow-up with PCP to discuss allergy management. Strict return precautions given. Pt verbalized understanding and agreed to plan.    Medications  predniSONE (DELTASONE) tablet 60 mg (60 mg Oral Given 01/10/16 1056)   Pt well appearing Other than mild rash no other signs of acute allergic/anaphylaxis D/c home Will give Rx for epipen as  she reports she is allergic to "everything" She denies known allergy to epipnephrine (initial allergies reported it caused jitters) MDM   Final diagnoses:  Allergic reaction, initial encounter    Nursing notes including past medical history and social history reviewed and considered in documentation  I personally performed the services described in this documentation, which was scribed in my presence. The recorded information has been reviewed and is accurate.         Ripley Fraise, MD 01/10/16 [phone]

## 2016-01-10 NOTE — ED Notes (Signed)
Having rash since November 2016, work at [employer] in [place].  Rash is to face, c/o itching.

## 2016-04-27 ENCOUNTER — Telehealth: Payer: Self-pay | Admitting: Internal Medicine

## 2016-04-27 NOTE — Telephone Encounter (Signed)
Received from [facility] forwarded 11 pages to Dr. Dorris Carnes 04/27/16

## 2016-06-22 ENCOUNTER — Encounter: Payer: Self-pay | Admitting: Internal Medicine

## 2016-07-06 ENCOUNTER — Encounter: Payer: Self-pay | Admitting: Internal Medicine

## 2016-07-06 ENCOUNTER — Ambulatory Visit (INDEPENDENT_AMBULATORY_CARE_PROVIDER_SITE_OTHER): Payer: BLUE CROSS/BLUE SHIELD | Admitting: Internal Medicine

## 2016-07-06 ENCOUNTER — Encounter (INDEPENDENT_AMBULATORY_CARE_PROVIDER_SITE_OTHER): Payer: Self-pay

## 2016-07-06 VITALS — BP 112/64 | HR 69 | Ht 65.0 in | Wt 185.8 lb

## 2016-07-06 DIAGNOSIS — R600 Localized edema: Secondary | ICD-10-CM | POA: Diagnosis not present

## 2016-07-06 DIAGNOSIS — R0602 Shortness of breath: Secondary | ICD-10-CM

## 2016-07-06 LAB — CBC
HEMATOCRIT: 42.3 % (ref 35.0–45.0)
Hemoglobin: 14.6 g/dL (ref 11.7–15.5)
MCH: 30 pg (ref 27.0–33.0)
MCHC: 34.5 g/dL (ref 32.0–36.0)
MCV: 87 fL (ref 80.0–100.0)
MPV: 10.4 fL (ref 7.5–12.5)
Platelets: 323 10*3/uL (ref 140–400)
RBC: 4.86 MIL/uL (ref 3.80–5.10)
RDW: 12.5 % (ref 11.0–15.0)
WBC: 9.8 10*3/uL (ref 3.8–10.8)

## 2016-07-06 LAB — BASIC METABOLIC PANEL
BUN: 10 mg/dL (ref 7–25)
CO2: 28 mmol/L (ref 20–31)
Calcium: 9.4 mg/dL (ref 8.6–10.2)
Chloride: 103 mmol/L (ref 98–110)
Creat: 0.74 mg/dL (ref 0.50–1.10)
Glucose, Bld: 96 mg/dL (ref 65–99)
Potassium: 4.2 mmol/L (ref 3.5–5.3)
Sodium: 139 mmol/L (ref 135–146)

## 2016-07-06 LAB — LIPID PANEL
CHOLESTEROL: 165 mg/dL (ref 125–200)
HDL: 54 mg/dL (ref >=46)
LDL Cholesterol: 98 mg/dL (ref <130)
Total CHOL/HDL Ratio: 3.1 Ratio (ref <=5.0)
Triglycerides: 64 mg/dL (ref <150)
VLDL: 13 mg/dL (ref <30)

## 2016-07-06 LAB — TSH: TSH: 0.1 m[IU]/L — AB

## 2016-07-06 NOTE — Patient Instructions (Signed)
Medication Instructions:  Your physician recommends that you continue on your current medications as directed. Please refer to the Current Medication list given to you today.   Labwork: TODAY: CBC/BMET/TSH/LIPID  Testing/Procedures: Your physician has requested that you have an echocardiogram. Echocardiography is a painless test that uses sound waves to create images of your heart. It provides your doctor with information about the size and shape of your heart and how well your heart's chambers and valves are working. This procedure takes approximately one hour. There are no restrictions for this procedure.   Follow-Up: Follow-up to be determined after testing.  Any Other Special Instructions Will Be Listed Below (If Applicable).     If you need a refill on your cardiac medications before your next appointment, please call your pharmacy.

## 2016-07-06 NOTE — Progress Notes (Signed)
Cardiology Office Note   Date:  07/06/2016   ID:  [name] July 25, 1966, MRN [id number]  PCP:  Octavio Graves, DO  Cardiologist:   Dorris Carnes, MD   Pt presents for eval fo SOB and LE edema     History of Present Illness: [name] is a 50 y.o. 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 levothyroxine (SYNTHROID, LEVOTHROID) 100 MCG tablet Take 125 mcg by mouth daily.     . diphenhydrAMINE (BENADRYL) 25 MG tablet Take 25-50 mg by mouth every 8 (eight) hours as needed for itching or allergies.    Marland Kitchen EPINEPHrine (EPIPEN 2-PAK) 0.3 mg/0.3 mL IJ SOAJ injection Inject 0.3 mLs (0.3 mg total) into the muscle once. (Patient not taking: Reported on 07/06/2016) 1 Device 1  . predniSONE (DELTASONE) 50 MG tablet One tablet PO daily for 4 days (Patient not taking: Reported on 07/06/2016) 4 tablet 0  . triamcinolone cream (KENALOG) 0.5 % Apply 0.5 application topically 3 (three) times daily as needed. Arms face  0   No facility-administered medications prior to visit.      Allergies:   Other; Penicillins; and Latex   Past Medical History:  Diagnosis Date  . Chest pain   . Hyperthyroidism   . Peripheral edema   . Pott's disease   . Thyroid disease   . Wears glasses     Past Surgical History:  Procedure Laterality Date  . ARTHROSCOPIC REPAIR ACL  1986   right knee  . KNEE ARTHROPLASTY    . MASS EXCISION Right 09/30/2014   Procedure: EXCISION  MUCOID TUMOR RIGHT  THUMB/DEBRIDMENT INTERPHALANGEAL JOINT;  Surgeon: Daryll Brod, MD;  Location: [facility];  Service: Orthopedics;  Laterality: Right;  . THYROID SURGERY    . THYROIDECTOMY  2006  . TILT TABLE STUDY  2007     Social History:  The patient  reports that she has quit smoking. She smoked 0.50 packs per day. She has never used smokeless tobacco. She reports that she does not drink alcohol or use drugs.   Family History:  The patient's family history includes Heart disease in her father and mother.    ROS:  Please see the history of present illness. All other systems are reviewed and  Negative to the above problem except as noted.    PHYSICAL EXAM: VS:  BP 112/64   Pulse 69   Ht 5\' 5"  (1.651 m)   Wt 185 lb 12.8 oz (84.3 kg)   LMP 09/26/2014   BMI 30.92 kg/m   GEN: Well nourished, well developed, in no acute distress HEENT: normal Neck: no JVD, carotid bruits, or masses Cardiac: RRR; no murmurs, rubs, or gallops,Tr   edema  Respiratory:  clear to auscultation bilaterally, normal work of breathing GI: soft, nontender, nondistended, + BS  No hepatomegaly  MS: no deformity Moving all extremities   Skin: warm and dry, no rash Neuro:  Strength and sensation are intact Psych: euthymic mood, full affect   EKG:  EKG is ordered today.  Sr 80      Lipid Panel No results found for: CHOL, TRIG, HDL, CHOLHDL, VLDL, LDLCALC, LDLDIRECT    Wt Readings from Last 3 Encounters:  07/06/16 185 lb 12.8 oz (84.3 kg)  01/10/16 183 lb (83 kg)  09/30/14 165 lb 12.8 oz (75.2 kg)      ASSESSMENT AND PLAN:  1  Edema  Triv on exam  With all of above I would recom an echo to eval diastolic dysfunction  2  POTs  Improved  Has already cut back on salt  3  Tob  Quit again  Says she is not going back  4  Thyroid   Check TSH     Current medicines are reviewed at length with the patient today.  The patient does not have concerns regarding medicines. Signed, Dorris Carnes, MD  07/06/2016  10:37 AM    [facility] [address], [place], [place]  [address] Phone: [phone]; Fax: [phone]

## 2016-07-12 ENCOUNTER — Telehealth: Payer: Self-pay | Admitting: Internal Medicine

## 2016-07-12 MED ORDER — LEVOTHYROXINE SODIUM 100 MCG PO TABS: 100.0000 ug | ORAL_TABLET | Freq: Every day | ORAL | 1 refills | Status: DC

## 2016-07-12 NOTE — Telephone Encounter (Signed)
Follow Up:; ° ° °Returning your call. °

## 2016-07-12 NOTE — Telephone Encounter (Signed)
Patient is calling for lab results.  I called her back, got her voice mail.  Asked her to return my call.

## 2016-07-12 NOTE — Telephone Encounter (Signed)
Reviewed results with patient. She voices understanding.  Her PCP Dr. Melina Copa manages her thyroid labs and meds. Advised we will send prescription for levothyroxine 100 mcg for 90 days, then she she call her PCP for appointment and follow up lab work.  (pt's record indicated she had a 100 mcg tablet and taking 125 mcg daily.  I called her back to clarify.  She verified that her tablets in her bottle are 125 mcg)  D/C'd this med and ordered 100 mcg.

## 2016-07-19 ENCOUNTER — Other Ambulatory Visit: Payer: Self-pay

## 2016-07-19 ENCOUNTER — Ambulatory Visit (HOSPITAL_COMMUNITY): Payer: BLUE CROSS/BLUE SHIELD | Attending: Cardiology

## 2016-07-19 DIAGNOSIS — R0602 Shortness of breath: Secondary | ICD-10-CM | POA: Diagnosis not present

## 2016-07-19 DIAGNOSIS — R600 Localized edema: Secondary | ICD-10-CM

## 2016-07-19 DIAGNOSIS — I501 Left ventricular failure: Secondary | ICD-10-CM | POA: Insufficient documentation

## 2016-12-03 ENCOUNTER — Encounter: Payer: Self-pay | Admitting: Family Medicine

## 2016-12-03 ENCOUNTER — Ambulatory Visit (INDEPENDENT_AMBULATORY_CARE_PROVIDER_SITE_OTHER): Payer: BLUE CROSS/BLUE SHIELD | Admitting: Family Medicine

## 2016-12-03 VITALS — BP 126/76 | HR 84 | Temp 97.8°F | Resp 18 | Ht 65.0 in | Wt 196.1 lb

## 2016-12-03 DIAGNOSIS — E039 Hypothyroidism, unspecified: Secondary | ICD-10-CM | POA: Insufficient documentation

## 2016-12-03 DIAGNOSIS — R600 Localized edema: Secondary | ICD-10-CM | POA: Diagnosis not present

## 2016-12-03 DIAGNOSIS — Z7689 Persons encountering health services in other specified circumstances: Secondary | ICD-10-CM | POA: Diagnosis not present

## 2016-12-03 DIAGNOSIS — I951 Orthostatic hypotension: Secondary | ICD-10-CM

## 2016-12-03 DIAGNOSIS — Z780 Asymptomatic menopausal state: Secondary | ICD-10-CM

## 2016-12-03 DIAGNOSIS — E785 Hyperlipidemia, unspecified: Secondary | ICD-10-CM

## 2016-12-03 DIAGNOSIS — Z720 Tobacco use: Secondary | ICD-10-CM

## 2016-12-03 HISTORY — DX: Asymptomatic menopausal state: Z78.0

## 2016-12-03 HISTORY — DX: Localized edema: R60.0

## 2016-12-03 HISTORY — DX: Tobacco use: Z72.0

## 2016-12-03 MED ORDER — BUPROPION HCL ER (SR) 150 MG PO TB12: 150.0000 mg | ORAL_TABLET | Freq: Two times a day (BID) | ORAL | 3 refills | Status: DC

## 2016-12-03 NOTE — Patient Instructions (Signed)
Need new lab tests I will send you a letter with your test results.  If there is anything of concern, we will call right away. Walk every day that you are able  Need old records  Continue current dermatology treatments  See me in 3 months

## 2016-12-03 NOTE — Progress Notes (Signed)
Chief Complaint  Patient presents with  . Establish Care   Patient is new to establish. Limited old records are available. Prior history of postural hypotension, not currently active Multiple environmental, food, and medicine allergies. Under care of allergist. Atopic skin. Patient had a thyroidectomy for a large nodule, is on thyroid replacement She is up-to-date with Pap smear and mammogram in 2017. Immunizations are up-to-date. Colonoscopy is due this year. She has no acute complaints today. I have discussed the multiple health risks associated with cigarette smoking including, but not limited to, cardiovascular disease, lung disease and cancer.  I have strongly recommended that smoking be stopped.  I have reviewed the various methods of quitting including cold [place], classes, nicotine replacements and prescription medications.  I have offered assistance in this difficult process.  She had successful with Wellbutrin at one point in the past. She is willing to try this medication again.    Patient Active Problem List   Diagnosis Date Noted  . Hypothyroidism 12/03/2016  . Hyperlipidemia 12/03/2016  . Pedal edema 12/03/2016  . Post-menopausal 12/03/2016  . Tobacco abuse 12/03/2016  . HYPOTENSION, ORTHOSTATIC 10/29/2008    Outpatient Encounter Prescriptions as of 12/03/2016  Medication Sig  . hydrOXYzine (ATARAX/VISTARIL) 25 MG tablet Take 25 mg by mouth at bedtime.  Marland Kitchen levothyroxine (SYNTHROID, LEVOTHROID) 100 MCG tablet Take 1 tablet (100 mcg total) by mouth daily before breakfast.  . buPROPion (WELLBUTRIN SR) 150 MG 12 hr tablet Take 1 tablet (150 mg total) by mouth 2 (two) times daily.   No facility-administered encounter medications on file as of 12/03/2016.     Past Medical History:  Diagnosis Date  . Allergy    multiple  . Arthritis    back and shoulders  . Chest pain   . Hyperlipidemia   . Hyperthyroidism   . Peripheral edema   . Thyroid disease   . Tobacco  abuse 12/03/2016  . Wears glasses     Past Surgical History:  Procedure Laterality Date  . ARTHROSCOPIC REPAIR ACL  1986   right knee  . KNEE ARTHROPLASTY    . 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 THYROID SURGERY    . THYROIDECTOMY  2006  . TILT TABLE STUDY  2007    Social History   Social History  . Marital status: Divorced    Spouse name: N/A  . Number of children: 0  . Years of education: 48   Occupational History  .      work in office   Social History Main Topics  . Smoking status: Current Every Day Smoker    Packs/day: 0.50    Types: Cigarettes    Start date: 10/15/1990  . Smokeless tobacco: Never Used  . Alcohol use No     Comment: very rare  . Drug use: No  . Sexual activity: Not Currently   Other Topics Concern  . 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 Heart disease Mother     stents 35  . Asthma Mother   . Hearing loss Mother   . Hypertension  Mother   . Heart disease Father   . Alzheimer's disease Father     died at 39  . Alcohol abuse Father   . Cancer Paternal Aunt     Review of Systems  Constitutional: Positive for malaise/fatigue. Negative for chills, fever and weight loss.  HENT: Negative for congestion and hearing loss.   Eyes: Negative for blurred vision and pain.  Respiratory: Negative for cough and shortness of breath.   Cardiovascular: Negative for chest pain and leg swelling.  Gastrointestinal: Negative for abdominal pain, constipation, diarrhea and heartburn.  Genitourinary: Negative for dysuria and frequency.  Musculoskeletal: Negative for falls, joint pain  and myalgias.  Skin: Positive for itching and rash.       Atopic dermatitis, dry skin  Neurological: Negative for dizziness, seizures and headaches.  Endo/Heme/Allergies: Positive for environmental allergies.  Psychiatric/Behavioral: Negative for depression. The patient is not nervous/anxious and does not have insomnia.     BP 126/76 (BP Location: Right Arm, Patient Position: Sitting, Cuff Size: Normal)   Pulse 84   Temp 97.8 F (36.6 C) (Oral)   Resp 18   Ht 5\' 5"  (1.651 m)   Wt 196 lb 1.3 oz (88.9 kg)   LMP 09/26/2014   SpO2 99%   BMI 32.63 kg/m   Physical Exam  Constitutional: She is oriented to person, place, and time. She appears well-developed and well-nourished.  Moderately overweight  HENT:  Head: Normocephalic and atraumatic.  Right Ear: External ear normal.  Left Ear: External ear normal.  Mouth/Throat: Oropharynx is clear and moist.  Eyes: Conjunctivae are normal. Pupils are equal, round, and reactive to light.  Neck: Normal range of motion. Neck supple. No thyromegaly present.  Cardiovascular: Normal rate, regular rhythm and normal heart sounds.   Pulmonary/Chest: Effort normal and breath sounds normal. No respiratory distress.  Abdominal: Soft. Bowel sounds are normal.  Musculoskeletal: Normal range of motion. She exhibits no edema.  Lymphadenopathy:    She has no cervical adenopathy.  Neurological: She is alert and oriented to person, place, and time. She displays normal reflexes.  Gait normal  Skin: Skin is warm and dry.  Very dry skin with patches of eczema  Psychiatric: She has a normal mood and affect. Her behavior is normal. Thought content normal.  Nursing note and vitals reviewed.  ASSESSMENT/PLAN:  1. Hypothyroidism, unspecified type  2. Hyperlipidemia, unspecified hyperlipidemia type  3. Pedal edema  4. Post-menopausal  5. Encounter to establish care with new doctor * - CBC - Lipid panel - TSH - Urinalysis, Routine w reflex  microscopic - VITAMIN D 25 Hydroxy (Vit-D Deficiency, Fractures) - Comprehensive metabolic panel  6. HYPOTENSION, ORTHOSTATIC  7. Tobacco abuse   Patient Instructions  Need new lab tests I will send you a letter with your test results.  If there is anything of concern, we will call right away. Walk every day that you are able  Need old records  Continue current dermatology treatments  See me in 3 months      Raylene Everts, MD

## 2016-12-04 ENCOUNTER — Encounter: Payer: Self-pay | Admitting: Family Medicine

## 2016-12-04 LAB — COMPREHENSIVE METABOLIC PANEL
ALBUMIN: 4.3 g/dL (ref 3.6–5.1)
ALT: 21 U/L (ref 6–29)
AST: 20 U/L (ref 10–35)
Alkaline Phosphatase: 101 U/L (ref 33–115)
BUN: 11 mg/dL (ref 7–25)
CO2: 30 mmol/L (ref 20–31)
CREATININE: 0.75 mg/dL (ref 0.50–1.10)
Calcium: 9 mg/dL (ref 8.6–10.2)
Chloride: 107 mmol/L (ref 98–110)
Glucose, Bld: 74 mg/dL (ref 65–99)
Potassium: 3.9 mmol/L (ref 3.5–5.3)
Sodium: 142 mmol/L (ref 135–146)
Total Bilirubin: 0.3 mg/dL (ref 0.2–1.2)
Total Protein: 6.7 g/dL (ref 6.1–8.1)

## 2016-12-04 LAB — URINALYSIS, ROUTINE W REFLEX MICROSCOPIC
Bilirubin Urine: NEGATIVE
Glucose, UA: NEGATIVE
Hgb urine dipstick: NEGATIVE
Ketones, ur: NEGATIVE
Nitrite: NEGATIVE
PH: 6 (ref 5.0–8.0)
Protein, ur: NEGATIVE
Specific Gravity, Urine: 1.022 (ref 1.001–1.035)

## 2016-12-04 LAB — URINALYSIS, MICROSCOPIC ONLY
Bacteria, UA: NONE SEEN [HPF]
CRYSTALS: NONE SEEN [HPF]
Casts: NONE SEEN [LPF]
Yeast: NONE SEEN [HPF]

## 2016-12-04 LAB — CBC
HEMATOCRIT: 41.9 % (ref 35.0–45.0)
Hemoglobin: 14.1 g/dL (ref 11.7–15.5)
MCH: 30.4 pg (ref 27.0–33.0)
MCHC: 33.7 g/dL (ref 32.0–36.0)
MCV: 90.3 fL (ref 80.0–100.0)
MPV: 10 fL (ref 7.5–12.5)
Platelets: 315 10*3/uL (ref 140–400)
RBC: 4.64 MIL/uL (ref 3.80–5.10)
RDW: 13.4 % (ref 11.0–15.0)
WBC: 9.2 10*3/uL (ref 3.8–10.8)

## 2016-12-04 LAB — LIPID PANEL
Cholesterol: 163 mg/dL (ref <200)
HDL: 57 mg/dL (ref >50)
LDL Cholesterol: 91 mg/dL (ref <100)
Total CHOL/HDL Ratio: 2.9 Ratio (ref <5.0)
Triglycerides: 76 mg/dL (ref <150)
VLDL: 15 mg/dL (ref <30)

## 2016-12-04 LAB — VITAMIN D 25 HYDROXY (VIT D DEFICIENCY, FRACTURES): Vit D, 25-Hydroxy: 18 ng/mL — ABNORMAL LOW (ref 30–100)

## 2016-12-04 LAB — TSH: TSH: 11.25 m[IU]/L — AB

## 2016-12-04 MED ORDER — LEVOTHYROXINE SODIUM 125 MCG PO TABS: 125.0000 ug | ORAL_TABLET | Freq: Every day | ORAL | 3 refills | Status: DC

## 2016-12-19 ENCOUNTER — Other Ambulatory Visit: Payer: Self-pay

## 2016-12-19 ENCOUNTER — Telehealth: Payer: Self-pay

## 2016-12-19 MED ORDER — VITAMIN D (ERGOCALCIFEROL) 1.25 MG (50000 UNIT) PO CAPS: 50000.0000 [IU] | ORAL_CAPSULE | ORAL | 0 refills | Status: DC

## 2016-12-25 NOTE — Telephone Encounter (Signed)
error 

## 2017-01-06 ENCOUNTER — Other Ambulatory Visit: Payer: Self-pay | Admitting: Internal Medicine

## 2017-01-10 ENCOUNTER — Telehealth: Payer: Self-pay | Admitting: Family Medicine

## 2017-01-10 DIAGNOSIS — E039 Hypothyroidism, unspecified: Secondary | ICD-10-CM

## 2017-01-10 NOTE — Telephone Encounter (Signed)
patient wants to know in the should go to lab or come in office to get her synfluid checked.  cb#: [phone]

## 2017-01-10 NOTE — Telephone Encounter (Signed)
ths order in chart, spoke to [name], aware to get labs next week.

## 2017-02-06 LAB — TSH: TSH: 0.06 mIU/L — ABNORMAL LOW

## 2017-02-07 ENCOUNTER — Telehealth: Payer: Self-pay

## 2017-02-07 ENCOUNTER — Encounter: Payer: Self-pay | Admitting: Family Medicine

## 2017-02-07 DIAGNOSIS — E039 Hypothyroidism, unspecified: Secondary | ICD-10-CM

## 2017-02-07 MED ORDER — LEVOTHYROXINE SODIUM 112 MCG PO TABS: 112.0000 ug | ORAL_TABLET | Freq: Every day | ORAL | 1 refills | Status: DC

## 2017-02-07 NOTE — Telephone Encounter (Signed)
Letter sent, labs ordered.

## 2017-04-29 ENCOUNTER — Other Ambulatory Visit: Payer: Self-pay

## 2017-04-29 MED ORDER — LEVOTHYROXINE SODIUM 112 MCG PO TABS: 112.0000 ug | ORAL_TABLET | Freq: Every day | ORAL | 1 refills | Status: DC

## 2017-07-08 ENCOUNTER — Other Ambulatory Visit: Payer: Self-pay | Admitting: Family Medicine

## 2017-07-08 NOTE — Telephone Encounter (Signed)
Called [name] and reminded her she needs to have her tsh rechecked. Will do tomorrow.

## 2017-07-09 LAB — TSH: TSH: 0.05 m[IU]/L — AB

## 2017-07-11 ENCOUNTER — Encounter: Payer: Self-pay | Admitting: Family Medicine

## 2017-07-11 MED ORDER — LEVOTHYROXINE SODIUM 100 MCG PO TABS: 100.0000 ug | ORAL_TABLET | Freq: Every day | ORAL | 3 refills | Status: DC

## 2017-08-03 ENCOUNTER — Other Ambulatory Visit: Payer: Self-pay | Admitting: Family Medicine

## 2017-08-05 NOTE — Telephone Encounter (Signed)
Seen 2 19 18

## 2017-08-20 ENCOUNTER — Ambulatory Visit: Payer: BLUE CROSS/BLUE SHIELD | Admitting: Family Medicine

## 2017-08-20 ENCOUNTER — Encounter: Payer: Self-pay | Admitting: Family Medicine

## 2017-08-20 VITALS — BP 126/78 | HR 88 | Temp 98.4°F | Resp 16 | Ht 65.0 in | Wt 198.0 lb

## 2017-08-20 DIAGNOSIS — E039 Hypothyroidism, unspecified: Secondary | ICD-10-CM | POA: Diagnosis not present

## 2017-08-20 DIAGNOSIS — E785 Hyperlipidemia, unspecified: Secondary | ICD-10-CM | POA: Diagnosis not present

## 2017-08-20 DIAGNOSIS — E663 Overweight: Secondary | ICD-10-CM | POA: Diagnosis not present

## 2017-08-20 DIAGNOSIS — Z780 Asymptomatic menopausal state: Secondary | ICD-10-CM

## 2017-08-20 DIAGNOSIS — Z720 Tobacco use: Secondary | ICD-10-CM

## 2017-08-20 MED ORDER — VARENICLINE TARTRATE 0.5 MG X 11 & 1 MG X 42 PO MISC: ORAL | 0 refills | Status: DC

## 2017-08-20 MED ORDER — BUPROPION HCL ER (SR) 150 MG PO TB12: 150.0000 mg | ORAL_TABLET | Freq: Two times a day (BID) | ORAL | 3 refills | Status: DC

## 2017-08-20 MED ORDER — VARENICLINE TARTRATE 1 MG PO TABS: 1.0000 mg | ORAL_TABLET | Freq: Two times a day (BID) | ORAL | 3 refills | Status: DC

## 2017-08-20 NOTE — Patient Instructions (Addendum)
Walk every day that you are able Try the low carb diet Need blood work in Feb See me for a PE in 5-6 months I have prescribed chantix Stop smoking You may taper and discontinue the wellbutrin as you are able

## 2017-08-20 NOTE — Progress Notes (Signed)
Chief Complaint  Patient presents with  . Follow-up   Patient is here for follow-up.  She has no complaints except that she has been unable to quit smoking on the Wellbutrin.  She requests a prescription for Chantix.  We discussed Chantix prescription, risks and benefits, and the need to quit smoking. She would like to continue on Wellbutrin.  She thinks she feels better while taking this medication.  It is refilled for her. She is also quite overweight.  She is gained weight since her last visit.  She is discouraged that her weight gain.  I discussed with her calorie balance, low-carb diet, and the need for increased activity and exercise. She has hypothyroidism.  She is on thyroid replacement.  She is due for a TSH.  Lab work is ordered.  She does not have any symptoms of heat or cold intolerance, skin or hair changes.  She does feel her thyroid hormone is too low because of her inability to lose weight.  Patient Active Problem List   Diagnosis Date Noted  . Hypothyroidism 12/03/2016  . Hyperlipidemia 12/03/2016  . Pedal edema 12/03/2016  . Post-menopausal 12/03/2016  . Tobacco abuse 12/03/2016  . HYPOTENSION, ORTHOSTATIC 10/29/2008    Outpatient Encounter Medications as of 08/20/2017  Medication Sig  . buPROPion (WELLBUTRIN SR) 150 MG 12 hr tablet Take 1 tablet (150 mg total) 2 (two) times daily by mouth.  . hydrOXYzine (ATARAX/VISTARIL) 25 MG tablet Take 25 mg by mouth at bedtime.  Marland Kitchen levothyroxine (SYNTHROID, LEVOTHROID) 112 MCG tablet TAKE 1 TABLET BY MOUTH EVERY DAY  . Vitamin D, Ergocalciferol, (DRISDOL) 50000 units CAPS capsule Take 1 capsule (50,000 Units total) by mouth every 7 (seven) days.  . varenicline (CHANTIX CONTINUING MONTH PAK) 1 MG tablet Take 1 tablet (1 mg total) 2 (two) times daily by mouth.  . varenicline (CHANTIX STARTING MONTH PAK) 0.5 MG X 11 & 1 MG X 42 tablet Take as directed   No facility-administered encounter medications on file as of 08/20/2017.      Allergies  Allergen Reactions  . Other Other (See Comments)    Ink (Hives,Rash) Rubber ("big blisters that bust") Leather (Hives,Rash)  . Penicillins Other (See Comments) and Hives    REACTION: unknown/childhood  . Latex Rash    REACTION: Also allergic to all rubbers, leathers with moisture and direct contact     Review of Systems  Constitutional: Positive for unexpected weight change. Negative for activity change and appetite change.       Weight gain  HENT: Negative for congestion, dental problem, postnasal drip and rhinorrhea.   Eyes: Negative for redness and visual disturbance.  Respiratory: Negative for cough and shortness of breath.   Cardiovascular: Negative for chest pain, palpitations and leg swelling.  Gastrointestinal: Negative for abdominal pain, constipation and diarrhea.  Endocrine: Negative for cold intolerance and heat intolerance.  Genitourinary: Negative for difficulty urinating, frequency and menstrual problem.       Hot flashes  Musculoskeletal: Negative for arthralgias and back pain.  Allergic/Immunologic: Positive for environmental allergies. Negative for food allergies.  Neurological: Negative for dizziness and headaches.  Psychiatric/Behavioral: Negative for dysphoric mood and sleep disturbance. The patient is not nervous/anxious.       BP 126/78 (BP Location: Right Arm, Patient Position: Sitting, Cuff Size: Normal)   Pulse 88   Temp 98.4 F (36.9 C) (Temporal)   Resp 16   Ht 5\' 5"  (1.651 m)   Wt 198 lb (89.8 kg)  LMP 09/26/2014   SpO2 100%   BMI 32.95 kg/m   Physical Exam  Constitutional: She is oriented to person, place, and time. She appears well-developed and well-nourished.  Moderately overweight  HENT:  Head: Normocephalic and atraumatic.  Right Ear: External ear normal.  Left Ear: External ear normal.  Mouth/Throat: Oropharynx is clear and moist.  Eyes: Conjunctivae are normal. Pupils are equal, round, and reactive to light.   Neck: Normal range of motion. Neck supple. No thyromegaly present.  Cardiovascular: Normal rate, regular rhythm and normal heart sounds.  Pulmonary/Chest: Effort normal and breath sounds normal. No respiratory distress.  Abdominal: Soft. Bowel sounds are normal.  Musculoskeletal: Normal range of motion. She exhibits no edema.  Lymphadenopathy:    She has no cervical adenopathy.  Neurological: She is alert and oriented to person, place, and time. She displays normal reflexes.  Gait normal  Skin: Skin is warm and dry.  patches of eczema  Psychiatric: She has a normal mood and affect. Her behavior is normal. Thought content normal.  Nursing note and vitals reviewed.   ASSESSMENT/PLAN:  1. Hypothyroidism, unspecified type  - CBC - COMPLETE METABOLIC PANEL WITH GFR - Lipid panel - TSH - Urinalysis, Routine w reflex microscopic  2. Hyperlipidemia, unspecified hyperlipidemia type   3. Post-menopausal   4. Tobacco abuse Counseled on quitting.  Prescription for Chantix  5. Patient overweight Counseled on diet and exercise.  Encouraged her to look up a low-carb diet on the Internet and figure out which foods are approved and discouraged.  With increased activity as recommended.   Patient Instructions  Walk every day that you are able Try the low carb diet Need blood work in Feb See me for a PE in 5-6 months I have prescribed chantix Stop smoking You may taper and discontinue the wellbutrin as you are able   Audrey Everts, MD

## 2017-09-29 ENCOUNTER — Other Ambulatory Visit: Payer: Self-pay | Admitting: Family Medicine

## 2017-10-01 MED ORDER — VARENICLINE TARTRATE 1 MG PO TABS: 1.0000 mg | ORAL_TABLET | Freq: Two times a day (BID) | ORAL | 1 refills | Status: DC

## 2017-10-01 NOTE — Telephone Encounter (Signed)
Seen 11 6 18

## 2018-01-21 ENCOUNTER — Encounter: Payer: BLUE CROSS/BLUE SHIELD | Admitting: Family Medicine

## 2018-03-21 ENCOUNTER — Encounter: Payer: Self-pay | Admitting: Family Medicine

## 2018-11-17 ENCOUNTER — Telehealth: Payer: Self-pay | Admitting: Family Medicine

## 2018-11-17 NOTE — Telephone Encounter (Signed)
Called pt to set up appt with [facility].  Voice mail full and could not leave msg.   Ok to set up new pt appt.

## 2018-11-24 ENCOUNTER — Encounter: Payer: Self-pay | Admitting: *Deleted

## 2018-11-25 ENCOUNTER — Encounter: Payer: Self-pay | Admitting: Family Medicine

## 2018-11-25 ENCOUNTER — Other Ambulatory Visit: Payer: Self-pay

## 2018-11-25 ENCOUNTER — Ambulatory Visit (INDEPENDENT_AMBULATORY_CARE_PROVIDER_SITE_OTHER): Payer: 59 | Admitting: Family Medicine

## 2018-11-25 VITALS — BP 121/82 | HR 76 | Resp 10 | Ht 65.0 in | Wt 197.0 lb

## 2018-11-25 DIAGNOSIS — Z23 Encounter for immunization: Secondary | ICD-10-CM

## 2018-11-25 DIAGNOSIS — Z8639 Personal history of other endocrine, nutritional and metabolic disease: Secondary | ICD-10-CM

## 2018-11-25 DIAGNOSIS — R0789 Other chest pain: Secondary | ICD-10-CM

## 2018-11-25 DIAGNOSIS — E669 Obesity, unspecified: Secondary | ICD-10-CM

## 2018-11-25 DIAGNOSIS — E039 Hypothyroidism, unspecified: Secondary | ICD-10-CM

## 2018-11-25 DIAGNOSIS — Z0000 Encounter for general adult medical examination without abnormal findings: Secondary | ICD-10-CM

## 2018-11-25 DIAGNOSIS — Z720 Tobacco use: Secondary | ICD-10-CM

## 2018-11-25 MED ORDER — VARENICLINE TARTRATE 0.5 MG X 11 & 1 MG X 42 PO MISC: ORAL | 0 refills | Status: DC

## 2018-11-25 NOTE — Patient Instructions (Signed)
    Thank you for coming into the office today. I appreciate the opportunity to provide you with the care for your overall health and wellness.  Today we discussed a few important items for your health. Maintain a well balanced diet low in salt and fat. I encourage you to walk with intention to keep heart rate up (30 minutes at a time) for a total of at least 150 minutes weekly if and when possible.  Encourage you to work on quitting smoking. I have sent in your Chantix, I am very honored to help you with this journey. I look forward to hearing about your success.  We have ordered several labs as discussed, we will notify you of the results. If you are on MyChart you will have access to these as well. I would like to wait to call in your thyroid, until I know your current level.   Your arm might be sore today from flu vaccine, that is normal. Can take tylenol and or motrin for pain if needed. Active use of the arm will help reduce this discomfort.    Please schedule an annual appt in 3 months.   It was a pleasure to see you and I look forward to continuing to work together on your health and well-being. Please do not hesitate to call the office if you need care or have questions about your care.  Have a wonderful day and week.  With Gratitude,  Cherly Beach, DNP, AGNP-BC

## 2018-11-25 NOTE — Progress Notes (Signed)
Subjective:  Patient ID: [name], female    DOB: September 26, 1966  Age: 53 y.o. MRN: [id number]  CC:  Chief Complaint  Patient presents with  . establish as a new patient    HPI Ms Radford Pax is a 53 year old female who presents today for a follow up of chronic conditions. 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 Really enjoying this position compared to her last. New insurance is in effect now and so she is able to come back to be followed up with.   Today she reports a few concerns, but overall is feeling good.  "Chest twinges" which she is worried about her heart, as her parents had heart disease and her mother had stents placed in her early 49's. She denies sweating, jaw/shoulder/or back pain with these. Denies difficult with SOB, flutter or palpitations when she feels these. Denies N/V as well. She is not on anything for BP. Reports her BP has usually been good, but worries it is higher or has been higher than what it is today in office. Previous EKG was NS in 06/2016. At that time she did not feel these 'twinges". She has a diagnosis of HLD in chart, but she has not had elevated labs in the Epic system. She says she does not recall this being a issue before. But is concerned due to family history and her not having the best diet. Would like more information about diet control and weight management.   Thyroid: Has not had lab work in over a year to check her thyroid levels. She has maintained her synthroid as she had enough refills to do this. She denies cold intolerance, loss of hair or brittle nails, or increase in weight.   Would like help to quit smoking. She as been on Chantix in the past it helped. She reports herself trying to reduce the amount she smokes. She only has 4-5 cigarettes daily, she was at half a pack a year ago.   She reports shoulder cracking. She denies trauma,  injury or pain associated with this. She has constant pop and now feels a "divet" or drop" is worried it is coming out of socket. 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 Concerned for arthritis. No swelling or bruising. Started last year. Other shoulder has same spot "drop" but not as bad. Denies changes in ROM. Has not had to take any medication for it. Does have history of arthritis in her back (chart also reports shoulders but she does not recall someone telling her this).   Weight: She reports not having the "greatest" of diets. But in January she started to implement healthier food options. She has reduced the amount of white foods: rice, potatoes, sugar, breads. She reports leisure walking on lunch break, but nothing that makes her HR go up. Would like to start a exercise regimen in time. Taking baby steps to help with diet first.   Health Maintenance: Does not want to have a pap smear, mammogram, or colonoscopy. She does not want to know if she has cancer because she has watched her family die of cancer and she knows it will happen one day. She reports that she will not take chemo or do radiation. She rather have the quality of life over quantity. She states she might change her mind in  the future about this, but as of today in the office she does not want any of the above testing. Does report wanting a flu vaccine. She is okay with getting vaccines.   Medications: She stopped Wellbutrin (she reports being on this for POTS, has not been treated for this in a while. Says she is light sensitive with it). She stopped vitamin D (she reports taking carnation instant breakfast and it has vitamins in it). She reports stopping vistaril (does not need any more due to job change and sleeping better). She reports running out of Chantix, would like refill. She reports continuing Synthroid.     Past Medical History:  Diagnosis Date  . Allergy    multiple  . Arthritis     back and shoulders  . Chest pain   . Hyperlipidemia   . Hyperthyroidism   . Peripheral edema   . Thyroid disease   . Tobacco abuse 12/03/2016  . Wears glasses     Past Surgical History:  Procedure Laterality Date  . ARTHROSCOPIC REPAIR ACL  1986   right knee  . KNEE ARTHROPLASTY    . 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 THYROID SURGERY    . THYROIDECTOMY  2006  . TILT TABLE STUDY  2007    Family History  Problem Relation Age of Onset  . Heart disease Mother        stents 54  . Asthma Mother   . Hearing loss Mother   . Hypertension Mother   . Heart disease Father   . Alzheimer's disease Father        died at 76  . Alcohol abuse Father   . Cancer Father        leukemia  . Cancer Paternal Aunt     Social History   Socioeconomic History  . Marital status: Divorced    Spouse name: Not on file  . Number of children: 0  . Years of education: 38  . Highest education level: Associate degree: occupational, Hotel manager, or vocational program  Occupational History  . Occupation: parts specialist     Comment: [place]  . Financial resource strain: Not hard at all  . Food insecurity:    Worry: Never true    Inability: Never true  . Transportation needs:    Medical: No    Non-medical: No  Tobacco Use  . Smoking status: Current Every Day Smoker    Packs/day: 0.25    Years: 26.00    Pack years: 6.50    Types: Cigarettes    Start date: 10/15/1990  . Smokeless tobacco: Never Used  Substance and Sexual Activity  . Alcohol use: No    Comment: very rare  . Drug use: No  . Sexual activity: Never  Lifestyle  . Physical activity:    Days per week: 0 days    Minutes per session: 0 min  . Stress: Only a little  Relationships  . Social connections:    Talks on  phone: More than three times a week    Gets together: Three times a week    Attends religious service: Never    Active member of club or organization: No    Attends meetings of clubs or organizations: Never    Relationship status: Divorced  . Intimate partner violence:  Fear of current or ex partner: No    Emotionally abused: No    Physically abused: No    Forced sexual activity: No  Other Topics Concern  . 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  HENT: Positive for congestion and postnasal drip. Negative for sinus pressure and sinus pain.   Eyes: Negative for visual disturbance.       Wears glasses  2019 June was last check  Respiratory: Negative for cough, shortness of breath and wheezing.   Cardiovascular: Negative for chest pain and palpitations.  Gastrointestinal: Positive for constipation.  Endocrine: Negative for cold intolerance, heat intolerance, polydipsia, polyphagia and polyuria.  Genitourinary: Negative.   Musculoskeletal: Positive for arthralgias.       Right shoulder "drop" cracking  Skin: Negative.   Neurological: Negative for dizziness and headaches.  Hematological: Negative.   Psychiatric/Behavioral: Negative for confusion and sleep disturbance. The patient is not nervous/anxious.   All other systems reviewed and are negative.   Objective:   Today's Vitals: BP 121/82   Pulse 76   Resp 10   Ht 5\' 5"  (1.651 m)   Wt 197 lb (89.4 kg)   LMP 09/26/2014   SpO2 99%   BMI 32.78 kg/m   Physical Exam Vitals signs and nursing note reviewed.  Constitutional:      General: She is not in acute distress.    Appearance: Normal appearance. She is obese. She is not ill-appearing.    HENT:     Head: Normocephalic.     Right Ear: Tympanic membrane normal.     Left Ear: Tympanic membrane normal.     Nose: Nose normal.  Eyes:     Conjunctiva/sclera: Conjunctivae normal.  Neck:     Musculoskeletal: Normal range of motion.  Cardiovascular:     Rate and Rhythm: Normal rate and regular rhythm.     Pulses: Normal pulses.  Pulmonary:     Effort: Pulmonary effort is normal.     Breath sounds: Normal breath sounds.  Abdominal:     Palpations: Abdomen is soft.  Musculoskeletal: Normal range of motion.     Right shoulder: She exhibits crepitus and deformity. She exhibits normal range of motion, no tenderness, no bony tenderness, no swelling, no effusion, no laceration, no pain, no spasm and normal strength.     Comments: Over RIGHT acromial site, there is a visible indention. There is no pain or tenderness associated. There is crepitus and auditory cracking.   LEFT has a significant less indention at the same site. Again no pain or tenderness. There is no crepitus or auditory cracking.  Both sides have normal ROM. Strength is excellent  Skin:    General: Skin is warm and dry.  Neurological:     Mental Status: She is alert and oriented to person, place, and time.  Psychiatric:        Mood and Affect: Mood normal.        Behavior: Behavior normal.        Thought Content: Thought content normal.     Assessment & Plan:       1. History of hyperlipidemia She has a listed history of HLD, but  labs in Epic do not back this. Family hx of heart disease. C/O "chest twinges" this is new, as well as she questions elevated BP outside of office.  No S&S of MI today in office. Educated on S&S of MI. It has been a year since labs, will order these today.   - COMPLETE METABOLIC PANEL WITH GFR - CBC - Lipid Profile - HgB A1c  2. Hypothyroidism, unspecified type Has maintained her medication, but will check level today prior to ordering next refill. Denies S&S of hypothyroid  today.  - TSH  3. Tobacco abuse Was on CHantix lost job, no insurance, unable to get refills. Would like to restart this today. Seems very motivated to quit. Educated on smoking cessation and benefits of health and wellness.   - varenicline (CHANTIX STARTING MONTH PAK) 0.5 MG X 11 & 1 MG X 42 tablet; Take one 0.5 mg tablet by mouth once daily for 3 days, then increase to one 0.5 mg tablet twice daily for 4 days, then increase to one 1 mg tablet twice daily.  Dispense: 53 tablet; Refill: 0  4. History of vitamin D deficiency Significantly low Vitamin D level in 2018. Will check this today as she stopped taking the ordered dose of Vitamin D last year. If low will recommend her starting back/. Educated on importance of calcium and vitamin D in bone health in women.  - Vitamin D (25 hydroxy)  5. Obesity (BMI 30.0-34.9) Weight has maintained since last OV, but she is still over the BMI for her Ht. Educated on a well balanced diet and the importance of physical activity. Encouraged her to get her HR up for 20-30 minutes daily or a total of 150 minutes weekly. Advised to stop this activity if she develops chest discomfort, as well as to let us know if this occurs.  Will assess her A1c as she is 46, overweight, with a hx of HLD.  - HgB A1c  6. Need for immunization against influenza Today in office she received the flu vaccine.   - Flu Vaccine QUAD 36+ mos IM  7. Chest Discomfort As discussed above. Will assess labs. EKG was ordered. It is different from previous in 06/2016: Today's she is SR with 1st AV block. She only has the twinges every now and then. Discussed need for proper diet and exercise.  Will bring her back in 3 months for follow up or as needed if Chest "twinges" persist. Educated on S&S of MI.  -EKG  in office   8. Healthcare maintenance Today in the office she refused to set up pap, mammogram, or colonoscopy. Spent 15-20 minutes on education on these and why they are important  for her health and wellbeing. She still declined, but reserved the right to change her mind. I advised her to call at any time to get these ordered and set up. She will return in 3 months for annual visit.    Follow-up:  02/24/2019   Perlie Mayo, NP

## 2018-11-26 ENCOUNTER — Encounter: Payer: Self-pay | Admitting: Family Medicine

## 2018-11-26 LAB — CBC
HEMATOCRIT: 42.8 % (ref 35.0–45.0)
Hemoglobin: 14.5 g/dL (ref 11.7–15.5)
MCH: 30.3 pg (ref 27.0–33.0)
MCHC: 33.9 g/dL (ref 32.0–36.0)
MCV: 89.5 fL (ref 80.0–100.0)
MPV: 11 fL (ref 7.5–12.5)
PLATELETS: 317 10*3/uL (ref 140–400)
RBC: 4.78 10*6/uL (ref 3.80–5.10)
RDW: 12.3 % (ref 11.0–15.0)
WBC: 8 10*3/uL (ref 3.8–10.8)

## 2018-11-26 LAB — HEMOGLOBIN A1C
EAG (MMOL/L): 6.2 (calc)
Hgb A1c MFr Bld: 5.5 % of total Hgb (ref <5.7)
MEAN PLASMA GLUCOSE: 111 (calc)

## 2018-11-26 LAB — COMPLETE METABOLIC PANEL WITH GFR
AG RATIO: 2 (calc) (ref 1.0–2.5)
ALBUMIN MSPROF: 4.3 g/dL (ref 3.6–5.1)
ALKALINE PHOSPHATASE (APISO): 104 U/L (ref 37–153)
ALT: 22 U/L (ref 6–29)
AST: 19 U/L (ref 10–35)
BILIRUBIN TOTAL: 0.7 mg/dL (ref 0.2–1.2)
BUN: 11 mg/dL (ref 7–25)
CO2: 30 mmol/L (ref 20–32)
CREATININE: 0.72 mg/dL (ref 0.50–1.05)
Calcium: 9.2 mg/dL (ref 8.6–10.4)
Chloride: 106 mmol/L (ref 98–110)
GFR, Est African American: 112 mL/min/{1.73_m2} (ref >=60)
GFR, Est Non African American: 97 mL/min/{1.73_m2} (ref >=60)
GLOBULIN: 2.2 g/dL (ref 1.9–3.7)
Glucose, Bld: 96 mg/dL (ref 65–99)
POTASSIUM: 4.5 mmol/L (ref 3.5–5.3)
SODIUM: 140 mmol/L (ref 135–146)
Total Protein: 6.5 g/dL (ref 6.1–8.1)

## 2018-11-26 LAB — LIPID PANEL
Cholesterol: 158 mg/dL (ref <200)
HDL: 55 mg/dL (ref >=50)
LDL Cholesterol (Calc): 89 mg/dL (calc)
NON-HDL CHOLESTEROL (CALC): 103 mg/dL (ref <130)
TRIGLYCERIDES: 59 mg/dL (ref <150)
Total CHOL/HDL Ratio: 2.9 (calc) (ref <5.0)

## 2018-11-26 LAB — VITAMIN D 25 HYDROXY (VIT D DEFICIENCY, FRACTURES): Vit D, 25-Hydroxy: 34 ng/mL (ref 30–100)

## 2018-11-26 LAB — TSH: TSH: 0.9 m[IU]/L

## 2018-11-29 ENCOUNTER — Emergency Department (HOSPITAL_COMMUNITY)
Admission: EM | Admit: 2018-11-29 | Discharge: 2018-11-29 | Disposition: A | Discharge status: Home / Self Care | Payer: 59 | Attending: Emergency Medicine | Admitting: Emergency Medicine

## 2018-11-29 ENCOUNTER — Encounter (HOSPITAL_COMMUNITY): Payer: Self-pay | Admitting: Emergency Medicine

## 2018-11-29 DIAGNOSIS — F17210 Nicotine dependence, cigarettes, uncomplicated: Secondary | ICD-10-CM | POA: Insufficient documentation

## 2018-11-29 DIAGNOSIS — Z96659 Presence of unspecified artificial knee joint: Secondary | ICD-10-CM | POA: Diagnosis not present

## 2018-11-29 DIAGNOSIS — E039 Hypothyroidism, unspecified: Secondary | ICD-10-CM | POA: Insufficient documentation

## 2018-11-29 DIAGNOSIS — Z79899 Other long term (current) drug therapy: Secondary | ICD-10-CM | POA: Insufficient documentation

## 2018-11-29 DIAGNOSIS — R5383 Other fatigue: Secondary | ICD-10-CM | POA: Diagnosis not present

## 2018-11-29 DIAGNOSIS — R531 Weakness: Secondary | ICD-10-CM | POA: Diagnosis present

## 2018-11-29 DIAGNOSIS — Z91040 Latex allergy status: Secondary | ICD-10-CM | POA: Insufficient documentation

## 2018-11-29 HISTORY — DX: Postural orthostatic tachycardia syndrome (POTS): G90.A

## 2018-11-29 HISTORY — DX: Tachycardia, unspecified: R00.0

## 2018-11-29 HISTORY — DX: Other specified cardiac arrhythmias: I49.8

## 2018-11-29 HISTORY — DX: Orthostatic hypotension: I95.1

## 2018-11-29 LAB — BASIC METABOLIC PANEL
Anion gap: 8 (ref 5–15)
BUN: 13 mg/dL (ref 6–20)
CO2: 24 mmol/L (ref 22–32)
Calcium: 8.8 mg/dL — ABNORMAL LOW (ref 8.9–10.3)
Chloride: 106 mmol/L (ref 98–111)
Creatinine, Ser: 0.88 mg/dL (ref 0.44–1.00)
GFR calc Af Amer: >60 mL/min (ref >60)
GFR calc non Af Amer: >60 mL/min (ref >60)
Glucose, Bld: 113 mg/dL — ABNORMAL HIGH (ref 70–99)
Potassium: 3.9 mmol/L (ref 3.5–5.1)
Sodium: 138 mmol/L (ref 135–145)

## 2018-11-29 LAB — URINALYSIS, ROUTINE W REFLEX MICROSCOPIC
Bilirubin Urine: NEGATIVE
Glucose, UA: NEGATIVE mg/dL
Hgb urine dipstick: NEGATIVE
Ketones, ur: NEGATIVE mg/dL
Nitrite: NEGATIVE
Protein, ur: NEGATIVE mg/dL
SPECIFIC GRAVITY, URINE: 1.006 (ref 1.005–1.030)
pH: 6 (ref 5.0–8.0)

## 2018-11-29 LAB — CBC
HCT: 44.6 % (ref 36.0–46.0)
Hemoglobin: 14.3 g/dL (ref 12.0–15.0)
MCH: 29.3 pg (ref 26.0–34.0)
MCHC: 32.1 g/dL (ref 30.0–36.0)
MCV: 91.4 fL (ref 80.0–100.0)
Platelets: 298 10*3/uL (ref 150–400)
RBC: 4.88 MIL/uL (ref 3.87–5.11)
RDW: 12.4 % (ref 11.5–15.5)
WBC: 10.8 10*3/uL — ABNORMAL HIGH (ref 4.0–10.5)
nRBC: 0 % (ref 0.0–0.2)

## 2018-11-29 LAB — TROPONIN I: Troponin I: <0.03 ng/mL (ref <0.03)

## 2018-11-29 NOTE — ED Provider Notes (Signed)
[facility] EMERGENCY DEPARTMENT Provider Note   CSN: [id number] Arrival date & time: 11/29/18  1333     History   Chief Complaint Chief Complaint  Patient presents with  . Fatigue    HPI [name] is a 53 y.o. female.  HPI  Presents to the hospital today with a complaint of generalized weakness and fatigue.  Per the patient she has been treated long-term for hypothyroidism and is taking her medications including her vitamin D, Synthroid and hydroxyzine as needed.  She had lab work done at her doctor's office on February 11 which I have reviewed and which shows that the patient has a normal TSH, she had a normal CBC, normal CMP and an EKG which showed a slight first-degree AV block but otherwise was unremarkable.  The patient presents here today because of increasing fatigue which occurred while she was out and about shopping today.  She denies fevers chills vomiting or diarrhea.  She feels diffusely generally weak, nonfocal, no changes in vision or speech and her friends and family who are here with her state that she appears to be her normal self just generally weak.  She recently got a vaccination for the flu 3 days ago.  Past Medical History:  Diagnosis Date  . Allergy    multiple  . Arthritis    back and shoulders  . Chest pain   . Hyperlipidemia   . Hyperthyroidism   . Peripheral edema   . POTS (postural orthostatic tachycardia syndrome)   . Thyroid disease   . Tobacco abuse 12/03/2016  . Wears glasses     Patient Active Problem List   Diagnosis Date Noted  . Hypothyroidism 12/03/2016  . Hyperlipidemia 12/03/2016  . Pedal edema 12/03/2016  . Post-menopausal 12/03/2016  . Tobacco abuse 12/03/2016  . HYPOTENSION, ORTHOSTATIC 10/29/2008    Past Surgical History:  Procedure Laterality Date  . ARTHROSCOPIC REPAIR ACL  1986   right knee  . KNEE ARTHROPLASTY    . MASS EXCISION Right 09/30/2014   Procedure: EXCISION  MUCOID TUMOR RIGHT THUMB/DEBRIDMENT  INTERPHALANGEAL JOINT;  Surgeon: Daryll Brod, MD;  Location: [facility];  Service: Orthopedics;  Laterality: Right;  . THYROID SURGERY    . THYROIDECTOMY  2006  . TILT TABLE STUDY  2007     OB History   No obstetric history on file.      Home Medications    Prior to Admission medications   Medication Sig Start Date End Date Taking? Authorizing Provider  acetaminophen (TYLENOL) 500 MG tablet Take 1,000 mg by mouth every 6 (six) hours as needed for mild pain or moderate pain.   Yes [provider]  levothyroxine (SYNTHROID, LEVOTHROID) 112 MCG tablet TAKE 1 TABLET BY MOUTH EVERY DAY 08/05/17  Yes Raylene Everts, MD  varenicline (CHANTIX STARTING MONTH PAK) 0.5 MG X 11 & 1 MG X 42 tablet Take one 0.5 mg tablet by mouth once daily for 3 days, then increase to one 0.5 mg tablet twice daily for 4 days, then increase to one 1 mg tablet twice daily. 11/25/18   Perlie Mayo, NP    Family History Family History  Problem Relation Age of Onset  . Heart disease Mother        stents 56  . Asthma Mother   . Hearing loss Mother   . Hypertension Mother   . Heart disease Father   . Alzheimer's disease Father        died at  49  . Alcohol abuse Father   . Cancer Father        leukemia  . Cancer Paternal Aunt     Social History Social History   Tobacco Use  . Smoking status: Current Every Day Smoker    Packs/day: 0.25    Years: 26.00    Pack years: 6.50    Types: Cigarettes    Start date: 10/15/1990  . Smokeless tobacco: Never Used  Substance Use Topics  . Alcohol use: No    Comment: very rare  . Drug use: No     Allergies   Other; Penicillins; and Latex   Review of Systems Review of Systems  All other systems reviewed and are negative.    Physical Exam Updated Vital Signs BP 136/79   Pulse 82   Temp 97.8 F (36.6 C) (Oral)   Resp 12   Ht 1.651 m (5\' 5" )   Wt 89.3 kg   LMP 09/26/2014   SpO2 99%   BMI 32.76 kg/m   Physical  Exam Vitals signs and nursing note reviewed.  Constitutional:      General: She is not in acute distress.    Appearance: She is well-developed.  HENT:     Head: Normocephalic and atraumatic.     Mouth/Throat:     Pharynx: No oropharyngeal exudate.  Eyes:     General: No scleral icterus.       Right eye: No discharge.        Left eye: No discharge.     Conjunctiva/sclera: Conjunctivae normal.     Pupils: Pupils are equal, round, and reactive to light.  Neck:     Musculoskeletal: Normal range of motion and neck supple.     Thyroid: No thyromegaly.     Vascular: No JVD.  Cardiovascular:     Rate and Rhythm: Normal rate and regular rhythm.     Heart sounds: Normal heart sounds. No murmur. No friction rub. No gallop.   Pulmonary:     Effort: Pulmonary effort is normal. No respiratory distress.     Breath sounds: Normal breath sounds. No wheezing or rales.  Abdominal:     General: Bowel sounds are normal. There is no distension.     Palpations: Abdomen is soft. There is no mass.     Tenderness: There is no abdominal tenderness.  Musculoskeletal: Normal range of motion.        General: No tenderness.  Lymphadenopathy:     Cervical: No cervical adenopathy.  Skin:    General: Skin is warm and dry.     Findings: No erythema or rash.  Neurological:     Mental Status: She is alert.     Coordination: Coordination normal.     Comments: Normal strength and sensation in all 4 extremities, she can straight leg raise with totally normal strength, she has no facial droop and cranial nerves III through XII are normal, gross visual acuity is normal.  Speech is clear and goal-directed  Psychiatric:        Behavior: Behavior normal.      ED Treatments / Results  Labs (all labs ordered are listed, but only abnormal results are displayed) Labs Reviewed  URINALYSIS, ROUTINE W REFLEX MICROSCOPIC - Abnormal; Notable for the following components:      Result Value   Color, Urine STRAW (*)     Leukocytes,Ua TRACE (*)    Bacteria, UA RARE (*)    All other components within normal limits  CBC - Abnormal; Notable for the following components:   WBC 10.8 (*)    All other components within normal limits  BASIC METABOLIC PANEL - Abnormal; Notable for the following components:   Glucose, Bld 113 (*)    Calcium 8.8 (*)    All other components within normal limits  TROPONIN I    EKG EKG Interpretation  Date/Time:  Saturday November 29 2018 15:16:17 EST Ventricular Rate:  68 PR Interval:    QRS Duration: 92 QT Interval:  388 QTC Calculation: 413 R Axis:   74 Text Interpretation:  Sinus rhythm First degree A-V block no other abnormalities since last tracing, no changes seen (11/25/18) Confirmed by Noemi Chapel [phone]) on 11/29/2018 3:32:11 PM   Radiology No results found.  Procedures Procedures (including critical care time)  Medications Ordered in ED Medications - No data to display   Initial Impression / Assessment and Plan / ED Course  I have reviewed the triage vital signs and the nursing notes.  Pertinent labs & imaging results that were available during my care of the patient were reviewed by me and considered in my medical decision making (see chart for details).    Normal cardiac pulmonary and abdominal exams, neurologic exam is unremarkable and the vital signs show no tachycardia and her blood pressure is 149/85.  We will perform orthostatic vital signs, to recheck her EKG, there is nothing of concern on her prior EKG after review of the medical record.  Patient is agreeable.  Also check urinalysis to rule out infection.  Vital signs remain normal, slight change with orthostatics however the patient states she now feels back to her normal self.  I do not see any signs of ischemia on EKG, no abnormal labs to of concern and the urinalysis was unremarkable as well.  Stable for discharge, patient given all of her results and expressed her understanding to the  indications for return.  Final Clinical Impressions(s) / ED Diagnoses   Final diagnoses:  Fatigue, unspecified type    ED Discharge Orders    None       Noemi Chapel, MD 11/29/18 1643

## 2018-11-29 NOTE — ED Triage Notes (Signed)
Pt states she started feeling bad earlier today and just is very tired and "wiped out."  Will barely answer questions at triage.

## 2018-11-29 NOTE — ED Triage Notes (Signed)
Pt states she just does not feel like she can wait at this time.  Advised there are no beds available at this time, but we would get her back as soon as we possibly could.  Comfort measures offered and pt declined.

## 2018-11-29 NOTE — Discharge Instructions (Signed)
Your testing today was reassuring, no definite abnormalities were found, please stay home for the next 1 to 2 days and drink plenty of fluids but return to the emergency department for severe or worsening symptoms.

## 2018-12-01 ENCOUNTER — Telehealth: Payer: Self-pay | Admitting: *Deleted

## 2018-12-01 NOTE — Telephone Encounter (Signed)
Pt called stating she needed her levothroxine filled. This can be sent to [facility] [address]. She saw Jarrett Soho last week and she had never heard if it was filled

## 2018-12-02 ENCOUNTER — Other Ambulatory Visit: Payer: Self-pay

## 2018-12-02 MED ORDER — LEVOTHYROXINE SODIUM 112 MCG PO TABS: 112.0000 ug | ORAL_TABLET | Freq: Every day | ORAL | 0 refills | Status: DC

## 2018-12-02 NOTE — Telephone Encounter (Signed)
Tried to call patient again, no answer, no way to leave message

## 2018-12-02 NOTE — Telephone Encounter (Signed)
Called patient no answer, no way to leave a message. Thyroid medication was refilled

## 2018-12-02 NOTE — Telephone Encounter (Signed)
Please advise, did you want to fill this as is? Or change anything?

## 2018-12-03 NOTE — Telephone Encounter (Signed)
Called patient was able to leave a message

## 2018-12-24 ENCOUNTER — Other Ambulatory Visit: Payer: Self-pay

## 2018-12-24 ENCOUNTER — Ambulatory Visit (INDEPENDENT_AMBULATORY_CARE_PROVIDER_SITE_OTHER): Payer: 59 | Admitting: Family Medicine

## 2018-12-24 ENCOUNTER — Encounter: Payer: Self-pay | Admitting: Family Medicine

## 2018-12-24 VITALS — BP 104/76 | HR 83 | Temp 99.0°F | Resp 14 | Ht 65.0 in | Wt 198.1 lb

## 2018-12-24 DIAGNOSIS — R509 Fever, unspecified: Secondary | ICD-10-CM | POA: Diagnosis not present

## 2018-12-24 NOTE — Patient Instructions (Addendum)
    Thank you for coming into the office today. I appreciate the opportunity to provide you with the care for your health and wellness.  Work note that states can return to work Monday.  Stay hydrated. Drink water (2L).  Take tylenol (325mg ) as needed for headache and fever.  Get plenty of rest, fresh air, fresh fruits and veggies.  [place]-can try for hot flashes  It was a pleasure to see you and I look forward to continuing to work together on your health and well-being. Please do not hesitate to call the office if you need care or have questions about your care.  Have a wonderful day and week.  With Gratitude,  Cherly Beach, DNP, AGNP-BC

## 2018-12-24 NOTE — Progress Notes (Signed)
Acute Visit   Chief Complaint  Patient presents with  . Fever    started Monday. Feels better today. fever was gone this morning  . Headache    1/10 was much worse Monday. Tuesday felt the worse  . upset stomach  . Diarrhea    took immodium and it helped. no diarrhea since taking it     Subjective:  [name] is a 53 y.o. female who presents for recent illness.  Reports that she started having a fever Monday fever was gone this morning.  Reports that she had diarrhea, used Imodium over-the-counter this helped.  Reported that she had a really terrible headache on Monday, reports 1 out of 10 pain scale for headache today.  Yesterday she felt much worse than today.  Even though she is improved she decided to go ahead and come into the office today.  Unsure if she has been around anybody has been sick recently.  As a coworker was at work.  Did get a flu vaccine.  Is febrile today in the office-99.0 oral.  Denies chills, sore throat, chest pain, shortness of breath, breathing trouble, sinus pain, sinus pressure or congestion.   Treatment to date: immodium.     ROS: Denies nausea, vomiting, urinary complaints, bleeding, bruising, rash. Denies chest pain, palpitations, edema Ten point review of systems negative. See HPI for details  PHYSICAL EXAM: Vitals:   12/24/18 1309  BP: 104/76  Pulse: 83  Resp: 14  Temp: 99 F (37.2 C)  SpO2: 97%    General appearance: Alert, WD/WN, speaking easily in no distress                             Skin: warm, no rash                           Head: No sinus tenderness                            Eyes: conjunctiva normal, corneas clear, PERRLA                            Ears: pearly TMs, external ear canals normal                          Nose: septum midline; nasal mucosa normal            Mouth/throat: MMM, tongue normal; OP: pink, without exudate or edema                          Neck: supple, no adenopathy, no thyromegaly, nontender                          Heart: RRR, normal S1, S2, no murmurs                         Lungs: CTA bilaterally, no wheezes, rales, or rhonchi   Psych:  Normal mood, affect, hygiene and grooming   Neuro: Alert and oriented.  Cranial nerves intact.  Normal strength, gait.       Assessment/Plan:  1. Fever, unspecified fever cause Discussed diagnosis and treatment of virus, possibly GI in nature, given  symptoms.  Suggested symptomatic OTC remedies: imodium, can take Tylenol or Ibuprofen OTC for fever and malaise.  Stay hydrated, get rest and fresh air daily.    Follow-up: As needed  Perlie Mayo, NP

## 2019-02-23 ENCOUNTER — Other Ambulatory Visit: Payer: Self-pay | Admitting: Family Medicine

## 2019-02-24 ENCOUNTER — Encounter: Payer: 59 | Admitting: Family Medicine

## 2019-02-27 ENCOUNTER — Encounter: Payer: 59 | Admitting: Family Medicine

## 2019-04-10 ENCOUNTER — Ambulatory Visit (HOSPITAL_COMMUNITY)
Admission: RE | Admit: 2019-04-10 | Discharge: 2019-04-10 | Disposition: A | Discharge status: Home / Self Care | Payer: Managed Care, Other (non HMO) | Source: Ambulatory Visit | Attending: Family Medicine | Admitting: Family Medicine

## 2019-04-10 ENCOUNTER — Encounter: Payer: Self-pay | Admitting: Family Medicine

## 2019-04-10 ENCOUNTER — Other Ambulatory Visit: Payer: Self-pay

## 2019-04-10 ENCOUNTER — Ambulatory Visit (INDEPENDENT_AMBULATORY_CARE_PROVIDER_SITE_OTHER): Payer: Managed Care, Other (non HMO) | Admitting: Family Medicine

## 2019-04-10 VITALS — BP 116/70 | HR 100 | Temp 98.4°F | Resp 12 | Ht 65.0 in | Wt 204.0 lb

## 2019-04-10 DIAGNOSIS — M7989 Other specified soft tissue disorders: Secondary | ICD-10-CM | POA: Insufficient documentation

## 2019-04-10 DIAGNOSIS — M25532 Pain in left wrist: Secondary | ICD-10-CM

## 2019-04-10 DIAGNOSIS — M549 Dorsalgia, unspecified: Secondary | ICD-10-CM

## 2019-04-10 MED ORDER — CELECOXIB 50 MG PO CAPS: 50.0000 mg | ORAL_CAPSULE | Freq: Every day | ORAL | 1 refills | Status: DC

## 2019-04-10 NOTE — Patient Instructions (Signed)
    Thank you for coming into the office today. I appreciate the opportunity to provide you with the care for your health and wellness. Today we discussed: wrist/hand pain  Follow Up: as needed  Get xrays today  Pick up medication at pharmacy   [place]. AVOID TOUCHING YOUR FACE, UNLESS YOUR HANDS ARE FRESHLY WASHED.   GET FRESH AIR DAILY. STAY HYDRATED WITH WATER.   It was a pleasure to see you and I look forward to continuing to work together on your health and well-being. Please do not hesitate to call the office if you need care or have questions about your care.  Have a wonderful day and week. With Gratitude, Cherly Beach, DNP, AGNP-BC  REST:  Take it easy; reduce regular exercise of activities of daily living as needed, but do not eliminate them. Change activity or components of activity; for example change running to walking. Stop sports; restrict squatting, kneeling, and repetitive bending. Limit weightbearing; immobilized with crutches or partial weightbearing.  ICE: Use cold in the acute phase of the injury (for 72 hours) to reduce pain and swelling. Use cold in the form of ice and a plastic bag or even use frozen peas in a bag. Apply cold 4 to 8 times a day for 20 minutes at a time, with 45 to 60 minutes between applications.  COMPRESSION: Use elastic bandages. Avoid trying to provide support with elastic bandages; a brace, overlapping tape, or cast may be needed.  ELEVATION: Elevate the joint above the level of the heart to reduce swelling. Apply cold compresses while the joint is elevated.  Exercise therapy: Begin exercise therapy after initial 48-hours; if pain and swelling begin to resolve. Do gentle range of motion exercises several times a day within limits of pain for 7 to 10 days. Start with non-resistive, nonweightbearing exercises Exercise after 6 minutes of icing to take advantage of the cold numbing impact. Use  exercises that improve range of motion and strength.  Maintain fitness of the extremity as best as possible.

## 2019-04-11 NOTE — Progress Notes (Signed)
Negative for visible fractures in wrist and hand. Some mild joint changes are noted in wrist. Continue the RICE we discussed. If you have started to develop arthritis you will need to use tylenol for pain, it is recommended to be the best, especially for mild changes.

## 2019-04-11 NOTE — Progress Notes (Signed)
Subjective:     Patient ID: [name], female   DOB: Apr 18, 1966, 53 y.o.   MRN: [id number]  [name] presents for wrist pain (started Tuesday night)  [name] is a 53 year old female who presents today with ongoing wrist pain that started Tuesday night after moving [place] a lot.  She reports that her arm was not extremely swollen or injured at the time.  Later on in the evening she started noticing that she had some swelling around her wrist and hands.  And it was not the same color it was lighter in color.  She reported having some numbness sensation like she has slept on it or if falling asleep with little bit tingling.  She denied having any significant injury or trauma to it at the time of the event.  Left wrist is location onset was Tuesday.  Last 4 days she is tried to use Tylenol, heat, ibuprofen, and ice without much relief.  Reports is a dull, constant discomfort.  The swelling prevents her from bending it well.  Hurts the most when she is moving it.  Reports that it made it difficult for her to sleep okay.  Denies having any fever, chills, cough, shortness of breath or any other signs or symptoms of COVID and/or infection today in the office.  Past Medical, Surgical, Social History, Allergies, and Medications have been Reviewed.    Past Medical History:  Diagnosis Date  . Allergy    multiple  . Arthritis    back and shoulders  . Chest pain   . Hyperlipidemia   . Hyperthyroidism   . Peripheral edema   . POTS (postural orthostatic tachycardia syndrome)   . Thyroid disease   . Tobacco abuse 12/03/2016  . Wears glasses    Past Surgical History:  Procedure Laterality Date  . ARTHROSCOPIC REPAIR ACL  1986   right knee  . KNEE ARTHROPLASTY    . 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  THYROID SURGERY    . THYROIDECTOMY  2006  . TILT TABLE STUDY  2007   Social History   Socioeconomic History  . Marital status: Divorced    Spouse name: Not on file  . Number of children: 0  . Years of education: 65  . Highest education level: Associate degree: occupational, Hotel manager, or vocational program  Occupational History  . Occupation: parts specialist     Comment: [employer]  . Financial resource strain: Not hard at all  . Food insecurity    Worry: Never true    Inability: Never true  . Transportation needs    Medical: No    Non-medical: No  Tobacco Use  . Smoking status: Former Smoker    Packs/day: 0.25    Years: 26.00    Pack years: 6.50    Types: Cigarettes    Start date: 10/15/1990    Quit date: 12/10/2018    Years since quitting: 0.3  . Smokeless tobacco: Never Used  Substance and Sexual Activity  . Alcohol use: No    Comment: very rare  . Drug use: No  . Sexual activity: Never  Lifestyle  . Physical activity    Days per week: 0 days    Minutes per session: 0 min  . Stress: Only a little  Relationships  . Social  connections    Talks on phone: More than three times a week    Gets together: Three times a week    Attends religious service: Never    Active member of club or organization: No    Attends meetings of clubs or organizations: Never    Relationship status: Divorced  . Intimate partner violence    Fear of current or ex partner: No    Emotionally abused: No    Physically abused: No    Forced sexual activity: No  Other Topics Concern  . 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 acetaminophen (TYLENOL) 500 MG tablet Take 1,000 mg by mouth every 6 (six) hours as needed for mild pain or  moderate pain.  Marland Kitchen levothyroxine (SYNTHROID) 112 MCG tablet TAKE 1 TABLET BY MOUTH EVERY DAY  . celecoxib (CELEBREX) 50 MG capsule Take 1 capsule (50 mg total) by mouth daily.  . [DISCONTINUED] varenicline (CHANTIX STARTING MONTH PAK) 0.5 MG X 11 & 1 MG X 42 tablet Take one 0.5 mg tablet by mouth once daily for 3 days, then increase to one 0.5 mg tablet twice daily for 4 days, then increase to one 1 mg tablet twice daily. (Patient not taking: Reported on 04/10/2019)   No facility-administered encounter medications on file as of 04/10/2019.    Allergies  Allergen Reactions  . Other Other (See Comments)    Ink (Hives,Rash) Rubber ("big blisters that bust") Leather (Hives,Rash)  . Penicillins Other (See Comments) and Hives    REACTION: unknown/childhood  . Latex Rash    REACTION: Also allergic to all rubbers, leathers with moisture and direct contact     Review of Systems  Constitutional: Negative for chills and fever.  HENT: Negative.   Eyes: Negative.   Respiratory: Negative.  Negative for cough and shortness of breath.   Cardiovascular: Negative.   Gastrointestinal: Negative.   Endocrine: Negative.   Genitourinary: Negative.   Musculoskeletal: Positive for arthralgias, joint swelling and myalgias.  Skin: Negative.   Allergic/Immunologic: Negative.   Neurological: Negative.   Hematological: Negative.   Psychiatric/Behavioral: Negative.   All other systems reviewed and are negative.      Objective:     BP 116/70   Pulse 100   Temp 98.4 F (36.9 C) (Oral)   Resp 12   Ht 5\' 5"  (1.651 m)   Wt 204 lb 0.6 oz (92.6 kg)   LMP 09/26/2014   SpO2 98%   BMI 33.95 kg/m   Physical Exam Vitals signs and nursing note reviewed.  Constitutional:      Appearance: Normal appearance. She is obese.  Neck:     Musculoskeletal: Normal range of motion.  Cardiovascular:     Rate and Rhythm: Normal rate and regular rhythm.     Pulses: Normal pulses.     Heart sounds: Normal heart  sounds.  Pulmonary:     Effort: Pulmonary effort is normal.     Breath sounds: Normal breath sounds.  Musculoskeletal:     Left wrist: She exhibits decreased range of motion, tenderness and swelling. She exhibits no bony tenderness, no effusion, no crepitus, no deformity and no laceration.     Comments: Mild snuff box tenderness  Skin:    General: Skin is warm.     Capillary Refill: Capillary refill takes less than 2 seconds.  Neurological:     Mental Status: She is alert and oriented to person, place, and time.  Psychiatric:        Mood and Affect: Mood normal.        Behavior: Behavior normal.        Thought Content: Thought content normal.        Judgment: Judgment normal.        Assessment and Plan        1. Wrist pain, acute, left Mild snuff box tenderness. Xray to rule out fx.  RICE encouraged  - DG Wrist Complete Left; Future - celecoxib (CELEBREX) 50 MG capsule; Take 1 capsule (50 mg total) by mouth daily.  Dispense: 30 capsule; Refill: 1  2. Swelling of left hand Educated on RICE measures. Xray to rule out fx or injury.   - DG Hand Complete Left; Future - celecoxib (CELEBREX) 50 MG capsule; Take 1 capsule (50 mg total) by mouth daily.  Dispense: 30 capsule; Refill: 1  3. Arthralgia of back Needed refill of this, she is out of her previously prescribed by Dr Meda Coffee.  - celecoxib (CELEBREX) 50 MG capsule; Take 1 capsule (50 mg total) by mouth daily.  Dispense: 30 capsule; Refill: 1   Follow Up: as needed  Perlie Mayo, DNP, AGNP-BC [place], [place], [place] [address] Office Hours: Mon-Thurs 8 am-5 pm; Fri 8 am-12 pm Office Phone:  [phone]  Office Fax: [phone]

## 2019-05-19 ENCOUNTER — Other Ambulatory Visit: Payer: Self-pay | Admitting: Family Medicine

## 2019-06-25 ENCOUNTER — Other Ambulatory Visit: Payer: Self-pay | Admitting: Family Medicine

## 2019-06-25 ENCOUNTER — Telehealth: Payer: Self-pay | Admitting: Family Medicine

## 2019-06-25 DIAGNOSIS — Z720 Tobacco use: Secondary | ICD-10-CM

## 2019-06-25 MED ORDER — CHANTIX STARTING MONTH PAK 0.5 MG X 11 & 1 MG X 42 PO TABS: ORAL_TABLET | ORAL | 0 refills | Status: DC

## 2019-06-25 NOTE — Telephone Encounter (Signed)
Pt is looking for another refill on Chantix

## 2019-06-25 NOTE — Telephone Encounter (Signed)
Patient stated she'd been at the lowest dose and it's easier for her to get her flu vaccine at the pharmacy.

## 2019-07-13 ENCOUNTER — Other Ambulatory Visit: Payer: Self-pay

## 2019-07-13 MED ORDER — LEVOTHYROXINE SODIUM 112 MCG PO TABS: 112.0000 ug | ORAL_TABLET | Freq: Every day | ORAL | 0 refills | Status: DC

## 2019-07-28 ENCOUNTER — Ambulatory Visit (INDEPENDENT_AMBULATORY_CARE_PROVIDER_SITE_OTHER): Payer: Managed Care, Other (non HMO) | Admitting: Family Medicine

## 2019-07-28 ENCOUNTER — Encounter: Payer: Self-pay | Admitting: Family Medicine

## 2019-07-28 ENCOUNTER — Other Ambulatory Visit: Payer: Self-pay

## 2019-07-28 VITALS — Temp 98.0°F | Ht 65.0 in | Wt 206.0 lb

## 2019-07-28 DIAGNOSIS — J0100 Acute maxillary sinusitis, unspecified: Secondary | ICD-10-CM | POA: Diagnosis not present

## 2019-07-28 DIAGNOSIS — J069 Acute upper respiratory infection, unspecified: Secondary | ICD-10-CM

## 2019-07-28 MED ORDER — AZITHROMYCIN 250 MG PO TABS: ORAL_TABLET | ORAL | 0 refills | Status: DC

## 2019-07-28 MED ORDER — PROMETHAZINE-DM 6.25-15 MG/5ML PO SYRP: 5.0000 mL | ORAL_SOLUTION | Freq: Four times a day (QID) | ORAL | 0 refills | Status: DC | PRN

## 2019-07-28 MED ORDER — CROMOLYN SODIUM 5.2 MG/ACT NA AERS: 1.0000 | INHALATION_SPRAY | Freq: Four times a day (QID) | NASAL | 1 refills | Status: DC

## 2019-07-28 NOTE — Progress Notes (Signed)
Virtual Visit via Telephone Note   This visit type was conducted due to national recommendations for restrictions regarding the COVID-19 Pandemic (e.g. social distancing) in an effort to limit this patient's exposure and mitigate transmission in our community.  Due to her co-morbid illnesses, this patient is at least at moderate risk for complications without adequate follow up.  This format is felt to be most appropriate for this patient at this time.  The patient did not have access to video technology/had technical difficulties with video requiring transitioning to audio format only (telephone).  All issues noted in this document were discussed and addressed.  No physical exam could be performed with this format.  Evaluation Performed:  Follow-up visit  Date:  07/28/2019   ID:  [name], DOB August 24, 1966, MRN [id number]  Patient Location: Home Provider Location: Office  Location of Patient: Home Location of Provider: Telehealth Consent was obtain for visit to be over via telehealth. I verified that I am speaking with the correct person using two identifiers.  PCP:  Perlie Mayo, NP   Chief Complaint:  Sinuses  History of Present Illness:    [name] is a 53 y.o. female with history of multiple allergies, arthritis, hyperlipidemia, hyper thyroidism, pots, among others.  Reports that she normally gets sinus infections once or twice a year.  When her allergies flareup.  She has had allergy issues for the last 2 weeks consistently.  Has not been exposed to COVID that she is aware of.  Denies having any fevers or chills.  Reports cough predominantly at night this is been new in the last couple of days has some yellowish-greenish mucus that she can get up.  Mild ear pain but that is subsided.  Sore throat pain from nasal drainage.  Headache but that is subsided.  Mild facial discomfort.  The patient does not have all symptoms concerning for COVID-19 infection (fever, chills,  cough, or new shortness of breath).   Past Medical, Surgical, Social History, Allergies, and Medications have been Reviewed.   Past Medical History:  Diagnosis Date  . Allergy    multiple  . Arthritis    back and shoulders  . Chest pain   . Hyperlipidemia   . Hyperthyroidism   . Peripheral edema   . POTS (postural orthostatic tachycardia syndrome)   . Thyroid disease   . Tobacco abuse 12/03/2016  . Wears glasses    Past Surgical History:  Procedure Laterality Date  . ARTHROSCOPIC REPAIR ACL  1986   right knee  . KNEE ARTHROPLASTY    . 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 THYROID SURGERY    . THYROIDECTOMY  2006  . TILT TABLE STUDY  2007     Current Meds  Medication Sig  . acetaminophen (TYLENOL) 500 MG tablet Take 1,000 mg by mouth every 6 (six) hours as needed for mild pain or moderate pain.  . celecoxib (CELEBREX) 50 MG capsule Take 1 capsule (50 mg total) by mouth daily.  Marland Kitchen levothyroxine (SYNTHROID) 112 MCG tablet Take 1 tablet (112 mcg total) by mouth daily.  . varenicline (CHANTIX STARTING MONTH PAK) 0.5 MG X 11 & 1 MG X 42 tablet Take one 0.5 mg tablet by mouth once daily for 3 days, then increase to one 0.5 mg tablet twice daily for 4  days, then increase to one 1 mg tablet twice daily.     Allergies:   Other, Penicillins, and Latex   Social History   Tobacco Use  . Smoking status: Former Smoker    Packs/day: 0.25    Years: 26.00    Pack years: 6.50    Types: Cigarettes    Start date: 10/15/1990    Quit date: 12/10/2018    Years since quitting: 0.6  . Smokeless tobacco: Never Used  Substance Use Topics  . Alcohol use: No    Comment: very rare  . Drug use: No     Family Hx: The patient's family history includes Alcohol abuse in her father; Alzheimer's disease in her  father; Asthma in her mother; Cancer in her father and paternal aunt; Hearing loss in her mother; Heart disease in her father and mother; Hypertension in her mother.  ROS:   Please see the history of present illness.    All other systems reviewed and are negative.   Labs/Other Tests and Data Reviewed:   Recent Labs: 11/25/2018: ALT 22; TSH 0.90 11/29/2018: BUN 13; Creatinine, Ser 0.88; Hemoglobin 14.3; Platelets 298; Potassium 3.9; Sodium 138   Recent Lipid Panel Lab Results  Component Value Date/Time   CHOL 158 11/25/2018 10:08 AM   TRIG 59 11/25/2018 10:08 AM   HDL 55 11/25/2018 10:08 AM   CHOLHDL 2.9 11/25/2018 10:08 AM   LDLCALC 89 11/25/2018 10:08 AM    Wt Readings from Last 3 Encounters:  07/28/19 206 lb (93.4 kg)  04/10/19 204 lb 0.6 oz (92.6 kg)  12/24/18 198 lb 1.9 oz (89.9 kg)     Objective:    Vital Signs:  Temp 98 F (36.7 C) (Oral)   Ht 5\' 5"  (1.651 m)   Wt 206 lb (93.4 kg)   LMP 09/26/2014   BMI 34.28 kg/m    VITAL SIGNS:  reviewed GEN:  alert and oriented RESPIRATORY:  No shortness of breath noted in conversation, did hear mild cough PSYCH:  Normal affect and mood, good communication  ASSESSMENT & PLAN:    1. Acute non-recurrent maxillary sinusitis S&S are consistent with maxillary sinusitis.  Additionally she has allergies that are ongoing.  Advised for her to get an allergy medication if that seems to be what is going on.  As well as will prescribe it antibiotic as it is been increasingly getting worse over the last 2 weeks.  Reviewed side effects, risks and benefits of medication.   Patient acknowledged agreement and understanding of the plan.    - cromolyn (NASALCROM) 5.2 MG/ACT nasal spray; Place 1 spray into both nostrils 4 (four) times daily.  Dispense: 26 mL; Refill: 1 - azithromycin (ZITHROMAX) 250 MG tablet; Take 2 tablets (500 mg) on the first day. Then take 1 tablet (250mg ) on days 2-5.  Dispense: 6 tablet; Refill: 0  2. Upper respiratory  infection with cough and congestion Cough is 1 of her #1 symptoms.  Reports that is keeping her up at night as well.  And getting worse over the last several days to week.  Advised to take cough medicine as prescribed.  In addition to making sure she gets Mucinex so that she can keep the phlegm from settling in her chest.   Reviewed side effects, risks and benefits of medication.   Patient acknowledged agreement and understanding of the plan.   - promethazine-dextromethorphan (PROMETHAZINE-DM) 6.25-15 MG/5ML syrup; Take 5 mLs by mouth 4 (four) times daily as needed for cough.  Dispense:  118 mL; Refill: 0   Time:   Today, I have spent 10 minutes with the patient with telehealth technology discussing the above problems.     Medication Adjustments/Labs and Tests Ordered: Current medicines are reviewed at length with the patient today.  Concerns regarding medicines are outlined above.   Tests Ordered: No orders of the defined types were placed in this encounter.   Medication Changes: Meds ordered this encounter  Medications  . cromolyn (NASALCROM) 5.2 MG/ACT nasal spray    Sig: Place 1 spray into both nostrils 4 (four) times daily.    Dispense:  26 mL    Refill:  1    Order Specific Question:   Supervising Provider    Answer:   SIMPSON, MARGARET E [id number]  . azithromycin (ZITHROMAX) 250 MG tablet    Sig: Take 2 tablets (500 mg) on the first day. Then take 1 tablet (250mg ) on days 2-5.    Dispense:  6 tablet    Refill:  0    Order Specific Question:   Supervising Provider    Answer:   SIMPSON, MARGARET E [id number]  . promethazine-dextromethorphan (PROMETHAZINE-DM) 6.25-15 MG/5ML syrup    Sig: Take 5 mLs by mouth 4 (four) times daily as needed for cough.    Dispense:  118 mL    Refill:  0    Order Specific Question:   Supervising Provider    Answer:   Fayrene Helper [id number]    Disposition:  Follow up 10/28/2019  Signed, Perlie Mayo, NP  07/28/2019 4:51 PM     [facility]

## 2019-07-28 NOTE — Patient Instructions (Signed)
    I appreciate the opportunity to provide you with the care for your health and wellness. Today we discussed: URI-cough and congestion  Follow up: 3 months   No labs or referrals today  Today you were seen for URI/Sinus Infection.  Please take the prescribed medication as directed and complete the full dose to prevent reinfection. Take nasal spray and cough syrup as directed  Work note: Out 10/13-14 return to work on Thursday 10/15  Please eat yogurt or take a probiotic if possible to avoid a yeast infection (this is common) from antibiotic. Should you develop a infection call the office or send a message in [place] to let us know. We will address it quickly.  As suggested for symptom management use Nasal saline spray to help ease congestion and dry nasal passages. Can be used throughout the day as needed. Avoid forceful blowing of nose. It will be common to have some blood if blowing nose a lot or if nose is very dry. Can you a humidifier at home as well. Remember to wash and air it out regularly. Tylenol (325 mg 2 tablets every 6 hours) and or Ibuprofen (200- 400 mg every 8 hours) for fever, sore throat and body aches. Can consider use of a Neti-pot to wash out sinus cavity (twice daily). Please be aware that you need to use distilled or boiled water for these. If congestion is increasing and or coughing can use Mucinex (twice daily) for cough and congestion with full glass of water.  If you have a sore throat warm fluids like hot tea or lemon water can help sooth this.   Please hydrate, rest, get fresh air daily and wash your hands well.  I hope you feel better soon.  Call in 3-5 days if symptoms persist/worsen and you have completed your full course of medication.  Please continue to practice social distancing to keep you, your family, and our community safe.  If you must go out, please wear a mask and practice good handwashing.  It was a pleasure to see you and I look  forward to continuing to work together on your health and well-being. Please do not hesitate to call the office if you need care or have questions about your care.  Have a wonderful day and week. With Gratitude, Cherly Beach, DNP, AGNP-BC

## 2019-07-30 ENCOUNTER — Other Ambulatory Visit: Payer: Self-pay

## 2019-07-30 MED ORDER — LEVOTHYROXINE SODIUM 112 MCG PO TABS: 112.0000 ug | ORAL_TABLET | Freq: Every day | ORAL | 0 refills | Status: DC

## 2019-10-23 ENCOUNTER — Ambulatory Visit (INDEPENDENT_AMBULATORY_CARE_PROVIDER_SITE_OTHER): Payer: Managed Care, Other (non HMO) | Admitting: Family Medicine

## 2019-10-23 ENCOUNTER — Encounter: Payer: Self-pay | Admitting: Family Medicine

## 2019-10-23 ENCOUNTER — Other Ambulatory Visit: Payer: Self-pay

## 2019-10-23 DIAGNOSIS — E039 Hypothyroidism, unspecified: Secondary | ICD-10-CM

## 2019-10-23 DIAGNOSIS — Z720 Tobacco use: Secondary | ICD-10-CM | POA: Diagnosis not present

## 2019-10-23 NOTE — Patient Instructions (Signed)
Happy New Year! May you have a year filled with hope, love, happiness and laughter.  I appreciate the opportunity to provide you with care for your health and wellness. Today we discussed: Hypothyroidism and smoking sensation  Follow up: 6 months for annual vision no Pap  Labs placed today No referrals  Once we get the results of your TSH we will decide if he will stay on the current dose of Synthroid or if it needed to be adjusted.  We will call you as soon as we note this.  Please continue to work on your smoking cessation I am very proud of the progress that you have made.  As we discussed distraction is usually key when you want to smoke.  So placing a puzzle or something out that can help distract your brain to keep you from picking up a cigarette is very beneficial.  In addition to not being around people who do smoke you can use the phone for communication to make sure that you are not being exposed and triggered.  CONTINUE THE GREAT WORK WITH SMOKING CESSATION :) I KNOW YOU CAN DO THIS!  Please continue to practice social distancing to keep you, your family, and our community safe.  If you must go out, please wear a mask and practice good handwashing.  It was a pleasure to see you and I look forward to continuing to work together on your health and well-being. Please do not hesitate to call the office if you need care or have questions about your care.  Have a wonderful day and week. With Gratitude, Cherly Beach, DNP, AGNP-BC

## 2019-10-23 NOTE — Progress Notes (Signed)
Virtual Visit via Telephone Note   This visit type was conducted due to national recommendations for restrictions regarding the COVID-19 Pandemic (e.g. social distancing) in an effort to limit this patient's exposure and mitigate transmission in our community.  Due to her co-morbid illnesses, this patient is at least at moderate risk for complications without adequate follow up.  This format is felt to be most appropriate for this patient at this time.  The patient did not have access to video technology/had technical difficulties with video requiring transitioning to audio format only (telephone).  All issues noted in this document were discussed and addressed.  No physical exam could be performed with this format.     Evaluation Performed:  Follow-up visit  Date:  10/23/2019   ID:  [name], DOB [birth year], MRN [id number]  Patient Location: Home Provider Location: Office  Location of Patient: Home Location of Provider: Telehealth Consent was obtain for visit to be over via telehealth. I verified that I am speaking with the correct person using two identifiers.  PCP:  Perlie Mayo, NP   Chief Complaint:  hypothyroidism   History of Present Illness:    [name] is a 54 y.o. female with history of allergies, arthritis, chest pain, hyperlipidemia, hypothyroidism, smoker.  Reports today for follow-up on hypothyroidism.  Thyroid Problem Presents for follow-up visit. Patient reports no cold intolerance, constipation, fatigue or weight gain. The symptoms have been stable.   Reports that she has been changing her diet.  And working on smoking cessation.  Is not quite there yet but is doing much better and using the Chantix.  The patient does not have symptoms concerning for COVID-19 infection (fever, chills, cough, or new shortness of breath).   Past Medical, Surgical, Social History, Allergies, and Medications have been Reviewed.  Past Medical History:  Diagnosis Date  .  Allergy    multiple  . Arthritis    back and shoulders  . Chest pain   . Hyperlipidemia   . Hyperthyroidism   . Peripheral edema   . POTS (postural orthostatic tachycardia syndrome)   . Thyroid disease   . Tobacco abuse 12/03/2016  . Wears glasses    Past Surgical History:  Procedure Laterality Date  . ARTHROSCOPIC REPAIR ACL  1986   right knee  . KNEE ARTHROPLASTY    . 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 THYROID SURGERY    . THYROIDECTOMY  2006  . TILT TABLE STUDY  2007     No outpatient medications have been marked as taking for the 10/23/19 encounter (Office Visit) with Perlie Mayo, NP.     Allergies:   Other, Penicillins, and Latex   ROS:   Please see the history of present illness.     All other systems reviewed and are negative.   Labs/Other Tests and Data Reviewed:    Recent Labs: 11/25/2018: ALT 22; TSH 0.90 11/29/2018: BUN 13; Creatinine, Ser 0.88; Hemoglobin 14.3; Platelets 298; Potassium 3.9; Sodium 138   Recent Lipid Panel Lab Results  Component Value Date/Time   CHOL 158 11/25/2018 10:08 AM   TRIG 59 11/25/2018 10:08 AM   HDL 55 11/25/2018 10:08 AM   CHOLHDL 2.9 11/25/2018 10:08 AM   LDLCALC 89 11/25/2018 10:08 AM    Wt Readings from Last 3 Encounters:  07/28/19  206 lb (93.4 kg)  04/10/19 204 lb 0.6 oz (92.6 kg)  12/24/18 198 lb 1.9 oz (89.9 kg)     Objective:    Vital Signs:  LMP 09/26/2014    GEN:  alert and oriented  RESPIRATORY:  no shortness of breath noted in conversation PSYCH:  normal affect and mood   ASSESSMENT & PLAN:    1. Hypothyroidism, unspecified type  - TSH - COMPLETE METABOLIC PANEL WITH GFR  2. Nicotine abuse   Time:   Today, I have spent 10 minutes with the patient with telehealth technology discussing the above problems.      Medication Adjustments/Labs and Tests Ordered: Current medicines are reviewed at length with the patient today.  Concerns regarding medicines are outlined above.   Tests Ordered: Orders Placed This Encounter  Procedures  . TSH  . COMPLETE METABOLIC PANEL WITH GFR    Medication Changes: No orders of the defined types were placed in this encounter.   Disposition:  Follow up 6 months  Signed, Perlie Mayo, NP  10/23/2019 11:12 AM     [facility]

## 2019-10-23 NOTE — Assessment & Plan Note (Signed)
Asked about quitting: confirms they are currently smokes cigarettes Advise to quit smoking: Educated about QUITTING to reduce the risk of cancer, cardio and cerebrovascular disease. Assess willingness: Unwilling to quit at this time, but is working on cutting back. Assist with counseling and pharmacotherapy: Counseled for 5 minutes and literature provided. Arrange for follow up: working to quitting follow up in 3 months and continue to offer help.   Using chantix

## 2019-10-23 NOTE — Assessment & Plan Note (Signed)
Checking TSH level before refill. Overall no S&S of hypo or hyperthyroidism.

## 2019-10-24 ENCOUNTER — Other Ambulatory Visit: Payer: Self-pay | Admitting: Family Medicine

## 2019-10-24 DIAGNOSIS — E039 Hypothyroidism, unspecified: Secondary | ICD-10-CM

## 2019-10-24 LAB — COMPLETE METABOLIC PANEL WITH GFR
AG Ratio: 1.8 (calc) (ref 1.0–2.5)
ALT: 56 U/L — ABNORMAL HIGH (ref 6–29)
AST: 38 U/L — ABNORMAL HIGH (ref 10–35)
Albumin: 4.6 g/dL (ref 3.6–5.1)
Alkaline phosphatase (APISO): 94 U/L (ref 37–153)
BUN: 10 mg/dL (ref 7–25)
CO2: 28 mmol/L (ref 20–32)
Calcium: 9.5 mg/dL (ref 8.6–10.4)
Chloride: 103 mmol/L (ref 98–110)
Creat: 0.85 mg/dL (ref 0.50–1.05)
GFR, Est African American: 91 mL/min/{1.73_m2} (ref >=60)
GFR, Est Non African American: 79 mL/min/{1.73_m2} (ref >=60)
Globulin: 2.6 g/dL (calc) (ref 1.9–3.7)
Glucose, Bld: 91 mg/dL (ref 65–99)
Potassium: 4.1 mmol/L (ref 3.5–5.3)
Sodium: 140 mmol/L (ref 135–146)
Total Bilirubin: 0.8 mg/dL (ref 0.2–1.2)
Total Protein: 7.2 g/dL (ref 6.1–8.1)

## 2019-10-24 LAB — TSH: TSH: 4.81 mIU/L — ABNORMAL HIGH

## 2019-10-24 MED ORDER — LEVOTHYROXINE SODIUM 125 MCG PO TABS: 125.0000 ug | ORAL_TABLET | Freq: Every day | ORAL | 0 refills | Status: DC

## 2019-10-24 NOTE — Progress Notes (Signed)
Elevated TSH, Dose change of Synthroid. Follow up labs placed to be checked around 4-6 weeks. Ms Radford Pax was notified and verbalized understanding.  Perlie Mayo, DNP, AGNP-BC

## 2019-10-28 ENCOUNTER — Ambulatory Visit: Payer: Managed Care, Other (non HMO) | Admitting: Family Medicine

## 2019-12-17 ENCOUNTER — Other Ambulatory Visit: Payer: Self-pay | Admitting: *Deleted

## 2019-12-17 DIAGNOSIS — E039 Hypothyroidism, unspecified: Secondary | ICD-10-CM

## 2019-12-17 MED ORDER — LEVOTHYROXINE SODIUM 125 MCG PO TABS: 125.0000 ug | ORAL_TABLET | Freq: Every day | ORAL | 0 refills | Status: DC

## 2020-01-01 ENCOUNTER — Telehealth: Payer: Self-pay

## 2020-01-01 DIAGNOSIS — E039 Hypothyroidism, unspecified: Secondary | ICD-10-CM

## 2020-01-01 NOTE — Telephone Encounter (Signed)
Pt states that she is not sleeping and has loose stool, she thinks that the Thyroid medication is to much , she thinks she needs to be on 112mg ,    Pt believes she was taking the wrong medication which caused her numbers to be off.

## 2020-01-05 ENCOUNTER — Other Ambulatory Visit: Payer: Self-pay

## 2020-01-05 MED ORDER — LEVOTHYROXINE SODIUM 112 MCG PO TABS: 112.0000 ug | ORAL_TABLET | Freq: Every day | ORAL | 5 refills | Status: DC

## 2020-01-05 NOTE — Telephone Encounter (Signed)
She said she had accidentally not been taking her 166mcg thyroid pill because her otc allergy pill looked similar and she had been forgetting to take it and said that the 125 was too high for her and she started back taking the 112 and has been fine. I sent in the 147mcg and d/c'd the 125mg  and ordered repeat tsh for 12 weeks and mailed to her.

## 2020-03-26 LAB — TSH: TSH: 0.29 mIU/L — ABNORMAL LOW

## 2020-03-29 ENCOUNTER — Other Ambulatory Visit: Payer: Self-pay | Admitting: Family Medicine

## 2020-03-29 ENCOUNTER — Other Ambulatory Visit: Payer: Self-pay

## 2020-03-29 DIAGNOSIS — E039 Hypothyroidism, unspecified: Secondary | ICD-10-CM

## 2020-03-29 MED ORDER — LEVOTHYROXINE SODIUM 100 MCG PO TABS: 100.0000 ug | ORAL_TABLET | Freq: Every day | ORAL | 2 refills | Status: DC

## 2020-04-14 ENCOUNTER — Encounter: Payer: Managed Care, Other (non HMO) | Admitting: Family Medicine

## 2020-04-26 ENCOUNTER — Encounter: Payer: Managed Care, Other (non HMO) | Admitting: Family Medicine

## 2020-05-04 ENCOUNTER — Other Ambulatory Visit: Payer: Self-pay | Admitting: *Deleted

## 2020-05-04 DIAGNOSIS — E039 Hypothyroidism, unspecified: Secondary | ICD-10-CM

## 2020-05-04 MED ORDER — LEVOTHYROXINE SODIUM 100 MCG PO TABS: 100.0000 ug | ORAL_TABLET | Freq: Every day | ORAL | 2 refills | Status: DC

## 2020-05-17 ENCOUNTER — Encounter: Payer: Self-pay | Admitting: Family Medicine

## 2020-05-17 ENCOUNTER — Other Ambulatory Visit: Payer: Self-pay

## 2020-05-17 ENCOUNTER — Telehealth (INDEPENDENT_AMBULATORY_CARE_PROVIDER_SITE_OTHER): Payer: Managed Care, Other (non HMO) | Admitting: Family Medicine

## 2020-05-17 VITALS — BP 116/70 | Ht 65.0 in | Wt 204.0 lb

## 2020-05-17 DIAGNOSIS — Z7189 Other specified counseling: Secondary | ICD-10-CM

## 2020-05-17 DIAGNOSIS — J069 Acute upper respiratory infection, unspecified: Secondary | ICD-10-CM

## 2020-05-17 HISTORY — DX: Other specified counseling: Z71.89

## 2020-05-17 MED ORDER — CROMOLYN SODIUM 5.2 MG/ACT NA AERS: 1.0000 | INHALATION_SPRAY | Freq: Four times a day (QID) | NASAL | 1 refills | Status: DC

## 2020-05-17 MED ORDER — PROMETHAZINE-DM 6.25-15 MG/5ML PO SYRP: 5.0000 mL | ORAL_SOLUTION | Freq: Four times a day (QID) | ORAL | 0 refills | Status: DC | PRN

## 2020-05-17 NOTE — Patient Instructions (Addendum)
I appreciate the opportunity to provide you with care for your health and wellness. Today we discussed:  Viral infection   Follow up:  06/09/2020 as scheduled  No labs or referrals today  Work note for rest of the week -mychart or email (please let her know once it is completed)  Cough syrup and Nasal Spray sent in.  If not better by Friday morning let us know so I can send in an antibiotic if needed.  If COVID + please follow the guidelines they give you. I have attached some here as well.  Please continue to practice social distancing to keep you, your family, and our community safe.  If you must go out, please wear a mask and practice good handwashing.  It was a pleasure to see you and I look forward to continuing to work together on your health and well-being. Please do not hesitate to call the office if you need care or have questions about your care.  Have a wonderful day and week. With Gratitude, Cherly Beach, DNP, AGNP-BC     Person Under Monitoring Name: [name]  Location: [place] [address]   CORONAVIRUS DISEASE 2019 (COVID-19) Guidance for Persons Under Investigation You are being tested for the virus that causes coronavirus disease 2019 (COVID-19). Public health actions are necessary to ensure protection of your health and the health of others, and to prevent further spread of infection. COVID-19 is caused by a virus that can cause symptoms, such as fever, cough, and shortness of breath. The primary transmission from person to person is by coughing or sneezing. On November 13, 2018, the [place] announced a [employer] Emergency of International Concern and on November 14, 2018 the U.S. Department of Health and Human Services declared a public health emergency. If the virus that causesCOVID-19 spreads in the community, it could have severe public health consequences.  As a person under investigation for COVID-19, the [place] advises you to adhere to the following guidance until your test results are reported to you. If your test result is positive, you will receive additional information from your provider and your local health department at that time.   Remain at home until you are cleared by your health provider or public health authorities.   Keep a log of visitors to your home using the form provided. Any visitors to your home must be aware of your isolation status.  If you plan to move to a new address or leave the county, notify the local health department in your county.  Call a doctor or seek care if you have an urgent medical need. Before seeking medical care, call ahead and get instructions from the provider before arriving at the medical office, clinic or hospital. Notify them that you are being tested for the virus that causes COVID-19 so arrangements can be made, as necessary, to prevent transmission to others in the healthcare setting. Next, notify the local health department in your county.  If a medical emergency arises and you need to call 911, inform the first responders that you are being tested for the virus that causes COVID-19. Next, notify the local health department in your county.  Adhere to all guidance set forth by the [place] for [facility] of patients that is based on guidance from the Center for Disease Control and Prevention with suspected or confirmed COVID-19. It is  provided with this guidance for Persons Under Investigation.  Your health and the health of our community are our top priorities. Public Health officials remain available to provide assistance and counseling to you about COVID-19 and compliance with this guidance.  Provider: ____________________________________________________________ Date: ______/_____/_________  By signing below, you acknowledge that you have read  and agree to comply with this Guidance for Persons Under Investigation. ______________________________________________________________ Date: ______/_____/_________  WHO DO I CALL? You can find a list of local health departments here: [email or web address] Health Department: ____________________________________________________________________ Contact Name: ________________________________________________________________________ Telephone: ___________________________________________________________________________  Marice Potter, [place], Communicable Disease Branch COVID-19 Guidance for Persons Under Investigation December 20, 2018

## 2020-05-17 NOTE — Progress Notes (Signed)
Virtual Visit via Telephone Note   This visit type was conducted due to national recommendations for restrictions regarding the COVID-19 Pandemic (e.g. social distancing) in an effort to limit this patient's exposure and mitigate transmission in our community.  Due to her co-morbid illnesses, this patient is at least at moderate risk for complications without adequate follow up.  This format is felt to be most appropriate for this patient at this time.  The patient did not have access to video technology/had technical difficulties with video requiring transitioning to audio format only (telephone).  All issues noted in this document were discussed and addressed.  No physical exam could be performed with this format.    Evaluation Performed:  Follow-up visit  Date:  05/17/2020   ID:  [name], DOB 1966/01/14, MRN [id number]  Patient Location: Home Provider Location: Office/Clinic  Location of Patient: Home Location of Provider: Telehealth Consent was obtain for visit to be over via telehealth. I verified that I am speaking with the correct person using two identifiers.  PCP:  Perlie Mayo, NP   Chief Complaint: Sinus problem.  History of Present Illness:    [name] is a 54 y.o. female with with 2-1/2-day history of increased sinus issues.  She reports Sunday night she started having an scratchy throat.  She reports that her husband in the previous week had a similar issue.  She reports ears itching on inside. Denies pain with swallowing. Reports some congestion with coughing-yellow, min Denies sneezing. Sore throat and fever yesterday (100.4)- no fever today Drainage from nose-runny, blowing nose a lot. Denies chest pain, shortness of breath.   Is going to get rapid Covid test today.  The patient does not have symptoms concerning for COVID-19 infection (fever, chills, cough, or new shortness of breath).   Past Medical, Surgical, Social History, Allergies, and  Medications have been Reviewed.  Past Medical History:  Diagnosis Date  . Allergy    multiple  . Arthritis    back and shoulders  . Chest pain   . Hyperlipidemia   . Hyperthyroidism   . HYPOTENSION, ORTHOSTATIC 10/29/2008   POTS   . Pedal edema 12/03/2016  . Peripheral edema   . Post-menopausal 12/03/2016  . POTS (postural orthostatic tachycardia syndrome)   . Thyroid disease   . Tobacco abuse 12/03/2016  . Wears glasses    Past Surgical History:  Procedure Laterality Date  . ARTHROSCOPIC REPAIR ACL  1986   right knee  . KNEE ARTHROPLASTY    . 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 THYROID SURGERY    . THYROIDECTOMY  2006  . TILT TABLE STUDY  2007     Current Meds  Medication Sig  . acetaminophen (TYLENOL) 500 MG tablet Take 1,000 mg by mouth every 6 (six) hours as needed for mild pain or moderate pain.  . celecoxib (CELEBREX) 50 MG capsule Take 1 capsule (50 mg total) by mouth daily.  . cromolyn (NASALCROM) 5.2 MG/ACT nasal spray Place 1 spray into both nostrils 4 (four) times daily.  Marland Kitchen levothyroxine (SYNTHROID) 100 MCG tablet Take 1 tablet (100 mcg total) by mouth daily.  . promethazine-dextromethorphan (PROMETHAZINE-DM) 6.25-15 MG/5ML syrup Take 5 mLs by mouth 4 (four) times daily as needed for cough.  . varenicline (CHANTIX STARTING MONTH PAK) 0.5 MG X 11 &  1 MG X 42 tablet Take one 0.5 mg tablet by mouth once daily for 3 days, then increase to one 0.5 mg tablet twice daily for 4 days, then increase to one 1 mg tablet twice daily.     Allergies:   Other, Penicillins, and Latex   ROS:   Please see the history of present illness.    All other systems reviewed and are negative.   Labs/Other Tests and Data Reviewed:    Recent Labs: 10/23/2019: ALT 56; BUN 10; Creat 0.85; Potassium 4.1;  Sodium 140 03/25/2020: TSH 0.29   Recent Lipid Panel Lab Results  Component Value Date/Time   CHOL 158 11/25/2018 10:08 AM   TRIG 59 11/25/2018 10:08 AM   HDL 55 11/25/2018 10:08 AM   CHOLHDL 2.9 11/25/2018 10:08 AM   LDLCALC 89 11/25/2018 10:08 AM    Wt Readings from Last 3 Encounters:  05/17/20 204 lb (92.5 kg)  07/28/19 206 lb (93.4 kg)  04/10/19 204 lb 0.6 oz (92.6 kg)     Objective:    Vital Signs:  BP 116/70   Ht 5\' 5"  (1.651 m)   Wt 204 lb (92.5 kg)   LMP 09/26/2014   BMI 33.95 kg/m    VITAL SIGNS:  reviewed GEN:  Alert and oriented RESPIRATORY:  No shortness of breath noted in conversation PSYCH:  Normal affect and mood  ASSESSMENT & PLAN:    1. Upper respiratory infection with cough and congestion  - promethazine-dextromethorphan (PROMETHAZINE-DM) 6.25-15 MG/5ML syrup; Take 5 mLs by mouth 4 (four) times daily as needed for cough.  Dispense: 118 mL; Refill: 0 - cromolyn (NASALCROM) 5.2 MG/ACT nasal spray; Place 1 spray into both nostrils 4 (four) times daily.  Dispense: 26 mL; Refill: 1   2. Educated about COVID-19 virus infection    Time:   Today, I have spent 10 minutes with the patient with telehealth technology discussing the above problems.     Medication Adjustments/Labs and Tests Ordered: Current medicines are reviewed at length with the patient today.  Concerns regarding medicines are outlined above.   Tests Ordered: No orders of the defined types were placed in this encounter.   Medication Changes: No orders of the defined types were placed in this encounter.   Disposition:  Follow up as scheduled  Signed, Perlie Mayo, NP  05/17/2020 9:42 AM     [place]

## 2020-05-17 NOTE — Assessment & Plan Note (Signed)
Provided with extensive education regarding COVID-19.  Verbally as well as written information on AVS.

## 2020-05-17 NOTE — Assessment & Plan Note (Signed)
Given the short duration that she has had her symptoms.  We will wait on giving any antibiotics at this time.  Encouraged the use of cough syrup and nasal spray in addition to hydration and rest.  Work note provided.

## 2020-05-19 ENCOUNTER — Telehealth: Payer: Managed Care, Other (non HMO) | Admitting: Family Medicine

## 2020-05-20 ENCOUNTER — Other Ambulatory Visit: Payer: Self-pay | Admitting: Family Medicine

## 2020-05-20 DIAGNOSIS — J069 Acute upper respiratory infection, unspecified: Secondary | ICD-10-CM

## 2020-05-20 MED ORDER — AZITHROMYCIN 250 MG PO TABS: ORAL_TABLET | ORAL | 0 refills | Status: DC

## 2020-06-09 ENCOUNTER — Encounter: Payer: Managed Care, Other (non HMO) | Admitting: Family Medicine

## 2020-06-22 ENCOUNTER — Other Ambulatory Visit: Payer: Self-pay

## 2020-06-22 ENCOUNTER — Encounter: Payer: Self-pay | Admitting: Family Medicine

## 2020-06-22 ENCOUNTER — Ambulatory Visit (INDEPENDENT_AMBULATORY_CARE_PROVIDER_SITE_OTHER): Payer: Managed Care, Other (non HMO) | Admitting: Family Medicine

## 2020-06-22 VITALS — BP 140/90 | HR 93 | Temp 97.5°F | Resp 18 | Ht 65.0 in | Wt 216.8 lb

## 2020-06-22 DIAGNOSIS — E669 Obesity, unspecified: Secondary | ICD-10-CM | POA: Insufficient documentation

## 2020-06-22 DIAGNOSIS — E039 Hypothyroidism, unspecified: Secondary | ICD-10-CM

## 2020-06-22 DIAGNOSIS — E785 Hyperlipidemia, unspecified: Secondary | ICD-10-CM

## 2020-06-22 DIAGNOSIS — Z8639 Personal history of other endocrine, nutritional and metabolic disease: Secondary | ICD-10-CM

## 2020-06-22 DIAGNOSIS — Z8679 Personal history of other diseases of the circulatory system: Secondary | ICD-10-CM

## 2020-06-22 DIAGNOSIS — Z720 Tobacco use: Secondary | ICD-10-CM

## 2020-06-22 DIAGNOSIS — Z0001 Encounter for general adult medical examination with abnormal findings: Secondary | ICD-10-CM | POA: Diagnosis not present

## 2020-06-22 DIAGNOSIS — Z1211 Encounter for screening for malignant neoplasm of colon: Secondary | ICD-10-CM | POA: Insufficient documentation

## 2020-06-22 DIAGNOSIS — Z1231 Encounter for screening mammogram for malignant neoplasm of breast: Secondary | ICD-10-CM

## 2020-06-22 DIAGNOSIS — E66811 Obesity, class 1: Secondary | ICD-10-CM

## 2020-06-22 HISTORY — DX: Personal history of other diseases of the circulatory system: Z86.79

## 2020-06-22 HISTORY — DX: Personal history of other endocrine, nutritional and metabolic disease: Z86.39

## 2020-06-22 MED ORDER — BUPROPION HCL ER (XL) 150 MG PO TB24: 150.0000 mg | ORAL_TABLET | Freq: Every day | ORAL | 0 refills | Status: DC

## 2020-06-22 NOTE — Assessment & Plan Note (Signed)
Updated labs ordered, will provide treatment based off of results.

## 2020-06-22 NOTE — Patient Instructions (Addendum)
I appreciate the opportunity to provide you with care for your health and wellness. Today we discussed: overall health  Follow up: 6 months   Labs-fasting within the week at [place] (otherside of hospital) No referrals today  Start Wellbutrin daily.    Work on small changes to your diet to help yourself eat better.  EKG in office is normal today! Great news :)  Please continue to practice social distancing to keep you, your family, and our community safe.  If you must go out, please wear a mask and practice good handwashing.  It was a pleasure to see you and I look forward to continuing to work together on your health and well-being. Please do not hesitate to call the office if you need care or have questions about your care.  Have a wonderful day and week. With Gratitude, Cherly Beach, DNP, AGNP-BC  HEALTH MAINTENANCE RECOMMENDATIONS:  It is recommended that you get at least 30 minutes of aerobic exercise at least 5 days/week (for weight loss, you may need as much as 60-90 minutes). This can be any activity that gets your heart rate up. This can be divided in 10-15 minute intervals if needed, but try and build up your endurance at least once a week.  Weight bearing exercise is also recommended twice weekly.  Eat a healthy diet with lots of vegetables, fruits and fiber.  "Colorful" foods have a lot of vitamins (ie green vegetables, tomatoes, red peppers, etc).  Limit sweet tea, regular sodas and alcoholic beverages, all of which has a lot of calories and sugar.  Up to 1 alcoholic drink daily may be beneficial for women (unless trying to lose weight, watch sugars).  Drink a lot of water.  Calcium recommendations are 1200-1500 mg daily (1500 mg for postmenopausal women or women without ovaries), and vitamin D 1000 IU daily.  This should be obtained from diet and/or supplements (vitamins), and calcium should not be taken all at once, but in divided doses.  Monthly self  breast exams and yearly mammograms for women over the age of 71 is recommended.  Sunscreen of at least SPF 30 should be used on all sun-exposed parts of the skin when outside between the hours of 10 am and 4 pm (not just when at beach or pool, but even with exercise, golf, tennis, and yard work!)  Use a sunscreen that says "broad spectrum" so it covers both UVA and UVB rays, and make sure to reapply every 1-2 hours.  Remember to change the batteries in your smoke detectors when changing your clock times in the spring and fall.  Use your seat belt every time you are in a car, and please drive safely and not be distracted with cell phones and texting while driving.

## 2020-06-22 NOTE — Assessment & Plan Note (Signed)
EKG 07/05/2019.  Demonstrated first-degree heart block.  Repeat EKG today normal sinus rhythm 83 bpm.

## 2020-06-22 NOTE — Assessment & Plan Note (Signed)
Discussed monthly self breast exams and yearly mammograms; at least 30 minutes of aerobic activity at least 5 days/week and weight-bearing exercise 2x/week; proper sunscreen use reviewed; healthy diet, including goals of calcium and vitamin D intake and alcohol recommendations (less than or equal to 1 drink/day) reviewed; regular seatbelt use; changing batteries in smoke detectors.  Immunization recommendations discussed.  Colonoscopy recommendations reviewed.  

## 2020-06-22 NOTE — Assessment & Plan Note (Signed)
Encouraged heart healthy diet, updated labs ordered 

## 2020-06-22 NOTE — Assessment & Plan Note (Signed)
Checking TSH level before refill, overall no signs or symptoms of hypo or hyperthyroidism.

## 2020-06-22 NOTE — Assessment & Plan Note (Signed)
Deteriorated, will be starting Wellbutrin to help. Encouraged diet and lifestyle changes.  Focus on exercise as well.  Wt Readings from Last 3 Encounters:  06/22/20 216 lb 12.8 oz (98.3 kg)  05/17/20 204 lb (92.5 kg)  07/28/19 206 lb (93.4 kg)

## 2020-06-22 NOTE — Progress Notes (Signed)
Health Maintenance reviewed -   Immunization History  Administered Date(s) Administered  . Influenza,inj,Quad PF,6+ Mos 11/25/2018  . Influenza-Unspecified 07/29/2017  . PFIZER SARS-COV-2 Vaccination 05/31/2020, 06/21/2020  . Tdap 05/28/2012   Last Pap smear: 01/2014- declines repeat Last mammogram: Ordered today Last colonoscopy: Cologuard ordered today Last DEXA: Due at 5 Dentist: has up coming appt Ophtho: glasses- needs appt, has not been able to be seen Exercise: not currently  Smoker: smoking  Alcohol Use: none  Other doctors caring for patient include:  Patient Care Team: Perlie Mayo, NP as PCP - General (Family Medicine)  End of Life Discussion:  Patient does not have a living will and medical power of attorney  Subjective:   HPI  [name] is a 54 y.o. female who presents for CPE visit and follow-up on chronic medical conditions.  She has the following concerns: She started back smoking, she has high level of stress, overall has been feeling overwhelmed especially as she has gained weight  She denies having any trouble sleeping.  But reports that her husband has recently started telling her that she snores more.  She thinks is from extra weight gain.  She denies any trouble chewing or swallowing.  Has a dentist appointment coming up.  Denies having any changes in appetite except because she eats more now.  Denies having any changes in bowel or bladder habits.  Reports that she is willing to do the Cologuard.  Denies having any blood in urine or stool.  Denies having any memory issues.  Denies having any recent falls or injuries.  Does not use any assistive devices.  Denies having any skin issues.  Denies having hearing issues.  Reports some blurred visions at time off and on 2-3 times over the last couple of weeks.  Reports it happens more frequently on the weekends.  She is currently getting an eye doctor appointment here soon.  Does wear glasses.  She denies  wanting a flu shot today.  Secondary to just recently have her second Covid vaccine.  Is willing to start Wellbutrin to help with smoking cessation.  Additionally wants to work on her diet as well.  Has a history of having a first-degree AV heart block on EKG in 2020.  Secondary to increased weight and other findings.  Will repeat EKG today.  Not fasting today but will get updated labs in the next week.  Review Of Systems  Review of Systems  Constitutional: Negative.   HENT: Negative.   Eyes: Positive for visual disturbance.  Respiratory: Negative.   Cardiovascular: Negative.   Gastrointestinal: Negative.   Endocrine: Negative.   Genitourinary: Negative.   Musculoskeletal: Negative.   Skin: Negative.   Allergic/Immunologic: Negative.   Neurological: Negative.   Hematological: Negative.   Psychiatric/Behavioral: Negative.   All other systems reviewed and are negative.   Objective:   PHYSICAL EXAM:  BP 140/90 (BP Location: Right Arm, Patient Position: Sitting, Cuff Size: Normal)   Pulse 93   Temp (!) 97.5 F (36.4 C) (Temporal)   Resp 18   Ht 5\' 5"  (1.651 m)   Wt 216 lb 12.8 oz (98.3 kg)   LMP 09/26/2014   SpO2 98%   BMI 36.08 kg/m   Physical Exam Vitals and nursing note reviewed.  Constitutional:      Appearance: Normal appearance. She is well-developed and well-groomed. She is obese.  HENT:     Head: Normocephalic.     Right Ear: Hearing, tympanic membrane, ear  canal and external ear normal.     Left Ear: Hearing, tympanic membrane, ear canal and external ear normal.     Nose: Nose normal.     Mouth/Throat:     Lips: Pink.     Mouth: Mucous membranes are moist.     Pharynx: Oropharynx is clear. Uvula midline.  Eyes:     General: Lids are normal.     Extraocular Movements: Extraocular movements intact.     Conjunctiva/sclera: Conjunctivae normal.     Pupils: Pupils are equal, round, and reactive to light.  Neck:     Thyroid: No thyroid mass, thyromegaly or  thyroid tenderness.     Vascular: No carotid bruit.  Cardiovascular:     Rate and Rhythm: Normal rate and regular rhythm.     Pulses: Normal pulses.          Radial pulses are 2+ on the right side and 2+ on the left side.       Dorsalis pedis pulses are 2+ on the right side and 2+ on the left side.     Heart sounds: Normal heart sounds.  Pulmonary:     Effort: Pulmonary effort is normal.     Breath sounds: Normal breath sounds and air entry.  Abdominal:     General: Abdomen is flat. Bowel sounds are normal.     Palpations: Abdomen is soft.     Tenderness: There is no abdominal tenderness. There is no right CVA tenderness or left CVA tenderness.     Hernia: No hernia is present.  Musculoskeletal:        General: Normal range of motion.     Cervical back: Normal range of motion and neck supple.     Right lower leg: No edema.     Left lower leg: No edema.     Comments: MAE, ROM intact   Lymphadenopathy:     Cervical: No cervical adenopathy.  Skin:    General: Skin is warm and dry.     Capillary Refill: Capillary refill takes less than 2 seconds.  Neurological:     General: No focal deficit present.     Mental Status: She is alert and oriented to person, place, and time. Mental status is at baseline.     Cranial Nerves: Cranial nerves are intact.     Sensory: Sensation is intact.     Motor: Motor function is intact.     Coordination: Coordination is intact.     Gait: Gait is intact.     Deep Tendon Reflexes: Reflexes are normal and symmetric.  Psychiatric:        Attention and Perception: Attention and perception normal.        Mood and Affect: Mood and affect normal.        Speech: Speech normal.        Behavior: Behavior normal. Behavior is cooperative.        Thought Content: Thought content normal.        Cognition and Memory: Cognition and memory normal.        Judgment: Judgment normal.    EKG: History of first-degree AV heart block. EKG today in office normal sinus  rhythm 83 bpm.  Normal ECG tracing.  Verified by me.  Depression Screening  Depression screen [facility] 2/9 06/22/2020 05/17/2020 10/23/2019 07/28/2019 04/10/2019  Decreased Interest 0 0 0 0 0  Down, Depressed, Hopeless 0 0 0 0 0  PHQ - 2 Score 0 0 0 0 0  Altered sleeping 0 - - - -  Tired, decreased energy 0 - - - -  Change in appetite 0 - - - -  Feeling bad or failure about yourself  0 - - - -  Trouble concentrating 0 - - - -  Moving slowly or fidgety/restless 0 - - - -  Suicidal thoughts 0 - - - -  PHQ-9 Score 0 - - - -  Difficult doing work/chores Not difficult at all - - - -     Falls  Fall Risk  06/22/2020 05/17/2020 10/23/2019 07/28/2019 04/10/2019  Falls in the past year? 0 0 0 1 0  Number falls in past yr: 0 0 0 0 -  Injury with Fall? 0 0 0 0 0  Risk for fall due to : No Fall Risks No Fall Risks - - -  Follow up Falls evaluation completed Falls evaluation completed Falls evaluation completed;Education provided - -    Assessment & Plan:   1. Annual visit for general adult medical examination with abnormal findings   2. Nicotine abuse   3. Hyperlipidemia, unspecified hyperlipidemia type   4. Hypothyroidism, unspecified type   5. History of vitamin D deficiency   6. Tobacco abuse   7. Obesity (BMI 30.0-34.9)   8. Screening mammogram, encounter for   9. Encounter for screening for malignant neoplasm of colon   10. History of first degree AV block     Tests ordered Orders Placed This Encounter  Procedures  . MM Digital Screening  . CBC  . Comprehensive metabolic panel  . Hemoglobin A1c  . Lipid panel  . TSH  . VITAMIN D 25 Hydroxy (Vit-D Deficiency, Fractures)  . Cologuard    Plan: Please see assessment and plan per problem list above.   Meds ordered this encounter  Medications  . buPROPion (WELLBUTRIN XL) 150 MG 24 hr tablet    Sig: Take 1 tablet (150 mg total) by mouth daily.    Dispense:  30 tablet    Refill:  0    Order Specific Question:   Supervising Provider     Answer:   Tula Nakayama E [[id number]]   I have personally reviewed: The patient's medical and social history Their use of alcohol, tobacco or illicit drugs Their current medications and supplements The patient's functional ability including ADLs,fall risks, home safety risks, cognitive, and hearing and visual impairment Diet and physical activities Evidence for depression or mood disorders  The patient's weight, height, BMI, and visual acuity have been recorded in the chart.  I have made referrals, counseling, and provided education to the patient based on review of the above and I have provided the patient with a written personalized care plan for preventive services.    Note: This dictation was prepared with Dragon dictation along with smaller phrase technology. Similar sounding words can be transcribed inadequately or may not be corrected upon review. Any transcriptional errors that result from this process are unintentional.      Perlie Mayo, NP   06/22/2020

## 2020-06-22 NOTE — Assessment & Plan Note (Signed)
Was using Chantix was able to quit.  And ended up going back on smoking later in the year. Will be doing Wellbutrin as she reports that that is helped in the past.  Additionally she is having some stress  Asked about quitting: confirms they are currently smokes cigarettes Advise to quit smoking: Educated about QUITTING to reduce the risk of cancer, cardio and cerebrovascular disease. Assess willingness: Unwilling to quit at this time, but is working on cutting back. Assist with counseling and pharmacotherapy: Counseled for 5 minutes and literature provided. Arrange for follow up: wants to work on quitting follow up in 3 months and continue to offer help.

## 2020-06-27 ENCOUNTER — Other Ambulatory Visit (HOSPITAL_COMMUNITY)
Admission: RE | Admit: 2020-06-27 | Discharge: 2020-06-27 | Disposition: A | Discharge status: Home / Self Care | Payer: Managed Care, Other (non HMO) | Source: Ambulatory Visit | Attending: Family Medicine | Admitting: Family Medicine

## 2020-06-27 ENCOUNTER — Other Ambulatory Visit: Payer: Self-pay

## 2020-06-27 DIAGNOSIS — Z8639 Personal history of other endocrine, nutritional and metabolic disease: Secondary | ICD-10-CM | POA: Diagnosis present

## 2020-06-27 DIAGNOSIS — E785 Hyperlipidemia, unspecified: Secondary | ICD-10-CM | POA: Diagnosis present

## 2020-06-27 DIAGNOSIS — Z0001 Encounter for general adult medical examination with abnormal findings: Secondary | ICD-10-CM | POA: Diagnosis not present

## 2020-06-27 DIAGNOSIS — E039 Hypothyroidism, unspecified: Secondary | ICD-10-CM | POA: Insufficient documentation

## 2020-06-27 LAB — CBC
HCT: 44.5 % (ref 36.0–46.0)
Hemoglobin: 14.7 g/dL (ref 12.0–15.0)
MCH: 30.6 pg (ref 26.0–34.0)
MCHC: 33 g/dL (ref 30.0–36.0)
MCV: 92.5 fL (ref 80.0–100.0)
Platelets: 318 10*3/uL (ref 150–400)
RBC: 4.81 MIL/uL (ref 3.87–5.11)
RDW: 12.6 % (ref 11.5–15.5)
WBC: 8.4 10*3/uL (ref 4.0–10.5)
nRBC: 0 % (ref 0.0–0.2)

## 2020-06-27 LAB — LIPID PANEL
Cholesterol: 153 mg/dL (ref 0–200)
HDL: 52 mg/dL (ref >40)
LDL Cholesterol: 91 mg/dL (ref 0–99)
Total CHOL/HDL Ratio: 2.9 RATIO
Triglycerides: 50 mg/dL (ref <150)
VLDL: 10 mg/dL (ref 0–40)

## 2020-06-27 LAB — COMPREHENSIVE METABOLIC PANEL
ALT: 37 U/L (ref 0–44)
AST: 29 U/L (ref 15–41)
Albumin: 4.2 g/dL (ref 3.5–5.0)
Alkaline Phosphatase: 77 U/L (ref 38–126)
Anion gap: 11 (ref 5–15)
BUN: 9 mg/dL (ref 6–20)
CO2: 26 mmol/L (ref 22–32)
Calcium: 9.1 mg/dL (ref 8.9–10.3)
Chloride: 102 mmol/L (ref 98–111)
Creatinine, Ser: 0.75 mg/dL (ref 0.44–1.00)
GFR calc Af Amer: >60 mL/min (ref >60)
GFR calc non Af Amer: >60 mL/min (ref >60)
Glucose, Bld: 103 mg/dL — ABNORMAL HIGH (ref 70–99)
Potassium: 4.2 mmol/L (ref 3.5–5.1)
Sodium: 139 mmol/L (ref 135–145)
Total Bilirubin: 0.8 mg/dL (ref 0.3–1.2)
Total Protein: 7.3 g/dL (ref 6.5–8.1)

## 2020-06-27 LAB — HEMOGLOBIN A1C
Hgb A1c MFr Bld: 5.6 % (ref 4.8–5.6)
Mean Plasma Glucose: 114.02 mg/dL

## 2020-06-27 LAB — TSH: TSH: 6.383 u[IU]/mL — ABNORMAL HIGH (ref 0.350–4.500)

## 2020-06-27 LAB — VITAMIN D 25 HYDROXY (VIT D DEFICIENCY, FRACTURES): Vit D, 25-Hydroxy: 26.93 ng/mL — ABNORMAL LOW (ref 30–100)

## 2020-07-15 ENCOUNTER — Other Ambulatory Visit: Payer: Self-pay | Admitting: Family Medicine

## 2020-07-15 DIAGNOSIS — Z720 Tobacco use: Secondary | ICD-10-CM

## 2020-07-20 ENCOUNTER — Other Ambulatory Visit: Payer: Self-pay

## 2020-07-20 ENCOUNTER — Encounter: Payer: Self-pay | Admitting: Family Medicine

## 2020-07-20 ENCOUNTER — Ambulatory Visit (INDEPENDENT_AMBULATORY_CARE_PROVIDER_SITE_OTHER): Payer: Managed Care, Other (non HMO) | Admitting: Family Medicine

## 2020-07-20 VITALS — BP 136/70 | HR 93 | Resp 16 | Ht 64.0 in | Wt 209.1 lb

## 2020-07-20 DIAGNOSIS — R2233 Localized swelling, mass and lump, upper limb, bilateral: Secondary | ICD-10-CM | POA: Diagnosis not present

## 2020-07-20 NOTE — Assessment & Plan Note (Signed)
Mass palpated under right axillary, questionable mass under left axillary.  Has not had a mammogram in some years.  Ultrasounds of both underarms and diagnostic mammogram have been ordered.

## 2020-07-20 NOTE — Progress Notes (Signed)
Subjective:  Patient ID: [name], female    DOB: 1965-11-04  Age: 54 y.o. MRN: [id number]  CC:  Chief Complaint  Patient presents with  . Medication Management      HPI  HPI  [name] is a 54 year old female patient who presents today for an acute visit secondary to having discomfort under her right arm of which she says she has had a lump there for years off and on sometimes it gets bigger sometimes it gets a little.  Previous provider says she was not sure what it was but just to leave it alone at that time.  She has had mammograms in the past and never had anything found.  Patient reports that she is not had a mammogram in a couple years at least.  She denies having any changes in the breast tissue including but not limited to nipple discharge, skin changes, lumps bumps or pain in breast tissue.  She is doing well on the Wellbutrin.  Has lost around 7 pounds in the last month.  Would like to continue this at this time.  Reports that she is eating much better as well.  Today patient denies signs and symptoms of COVID 19 infection including fever, chills, cough, shortness of breath, and headache. Past Medical, Surgical, Social History, Allergies, and Medications have been Reviewed.   Past Medical History:  Diagnosis Date  . Allergy    multiple  . Arthritis    back and shoulders  . Chest pain   . Educated about COVID-19 virus infection 05/17/2020  . Hyperlipidemia   . Hyperthyroidism   . HYPOTENSION, ORTHOSTATIC 10/29/2008   POTS   . Pedal edema 12/03/2016  . Peripheral edema   . Post-menopausal 12/03/2016  . POTS (postural orthostatic tachycardia syndrome)   . Thyroid disease   . Tobacco abuse 12/03/2016  . Wears glasses     Current Meds  Medication Sig  . acetaminophen (TYLENOL) 500 MG tablet Take 1,000 mg by mouth every 6 (six) hours as needed for mild pain or moderate pain.  . Cholecalciferol (VITAMIN D3) 125 MCG (5000 UT) CAPS Take 5,000 Units by mouth daily.   Marland Kitchen levothyroxine (SYNTHROID) 100 MCG tablet Take 1 tablet (100 mcg total) by mouth daily.  . [DISCONTINUED] buPROPion (WELLBUTRIN XL) 150 MG 24 hr tablet Take 1 tablet (150 mg total) by mouth daily.    ROS:  Review of Systems  Constitutional: Negative.   HENT: Negative.   Eyes: Negative.   Respiratory: Negative.   Cardiovascular: Negative.   Gastrointestinal: Negative.   Genitourinary: Negative.   Musculoskeletal: Negative.   Skin: Negative.        Bump under arms  Neurological: Negative.   Endo/Heme/Allergies: Negative.   Psychiatric/Behavioral: Negative.      Objective:   Today's Vitals: BP 136/70   Pulse 93   Resp 16   Ht 5\' 4"  (1.626 m)   Wt 209 lb 1.9 oz (94.9 kg)   LMP 09/26/2014   SpO2 98%   BMI 35.90 kg/m  Vitals with BMI 07/20/2020 06/22/2020 05/17/2020  Height 5\' 4"  5\' 5"  5\' 5"   Weight 209 lbs 2 oz 216 lbs 13 oz 204 lbs  BMI 35.88 12.24 82.50  Systolic 037 048 889  Diastolic 70 90 70  Pulse 93 93 -     Physical Exam Vitals and nursing note reviewed.  Constitutional:      Appearance: Normal appearance. She is well-developed and well-groomed. She is obese.  HENT:  Head: Normocephalic and atraumatic.     Right Ear: External ear normal.     Left Ear: External ear normal.     Nose: Nose normal.     Mouth/Throat:     Mouth: Mucous membranes are moist.     Pharynx: Oropharynx is clear.  Eyes:     General:        Right eye: No discharge.        Left eye: No discharge.     Conjunctiva/sclera: Conjunctivae normal.  Cardiovascular:     Rate and Rhythm: Normal rate and regular rhythm.     Pulses: Normal pulses.     Heart sounds: Normal heart sounds.  Pulmonary:     Effort: Pulmonary effort is normal.     Breath sounds: Normal breath sounds.  Musculoskeletal:        General: Normal range of motion.     Cervical back: Normal range of motion and neck supple.  Lymphadenopathy:     Upper Body:     Right upper body: Axillary adenopathy present.     Left  upper body: Axillary adenopathy present.  Skin:    General: Skin is warm.  Neurological:     General: No focal deficit present.     Mental Status: She is alert and oriented to person, place, and time.  Psychiatric:        Attention and Perception: Attention normal.        Mood and Affect: Mood normal.        Speech: Speech normal.        Behavior: Behavior normal. Behavior is cooperative.        Thought Content: Thought content normal.        Cognition and Memory: Cognition normal.        Judgment: Judgment normal.     Assessment   1. Mass of both axillae     Tests ordered Orders Placed This Encounter  Procedures  . US BREAST LTD UNI LEFT INC AXILLA  . US BREAST LTD UNI RIGHT INC AXILLA  . MM DIAG BREAST TOMO BILATERAL     Plan: Please see assessment and plan per problem list above.   No orders of the defined types were placed in this encounter.   Patient to follow-up in as scheduled   Perlie Mayo, NP

## 2020-07-20 NOTE — Patient Instructions (Signed)
  HAPPY FALL!  I appreciate the opportunity to provide you with care for your health and wellness. Today we discussed: bump in armpit  Follow up: as scheduled, or sooner if needed   No labs or referrals today Orders today: Ultrasound of left and right under arms.   Please continue to practice social distancing to keep you, your family, and our community safe.  If you must go out, please wear a mask and practice good handwashing.  It was a pleasure to see you and I look forward to continuing to work together on your health and well-being. Please do not hesitate to call the office if you need care or have questions about your care.  Have a wonderful day and week. With Gratitude, Cherly Beach, DNP, AGNP-BC

## 2020-07-26 ENCOUNTER — Other Ambulatory Visit: Payer: Self-pay

## 2020-07-26 ENCOUNTER — Ambulatory Visit (HOSPITAL_COMMUNITY)
Admission: RE | Admit: 2020-07-26 | Discharge: 2020-07-26 | Disposition: A | Discharge status: Home / Self Care | Payer: Managed Care, Other (non HMO) | Source: Ambulatory Visit | Attending: Family Medicine | Admitting: Family Medicine

## 2020-07-26 DIAGNOSIS — R2233 Localized swelling, mass and lump, upper limb, bilateral: Secondary | ICD-10-CM | POA: Diagnosis not present

## 2020-09-12 ENCOUNTER — Other Ambulatory Visit: Payer: Self-pay | Admitting: Family Medicine

## 2020-09-12 DIAGNOSIS — E039 Hypothyroidism, unspecified: Secondary | ICD-10-CM

## 2020-09-15 ENCOUNTER — Other Ambulatory Visit: Payer: Self-pay | Admitting: *Deleted

## 2020-09-15 ENCOUNTER — Encounter: Payer: Self-pay | Admitting: Family Medicine

## 2020-09-15 DIAGNOSIS — E039 Hypothyroidism, unspecified: Secondary | ICD-10-CM

## 2020-09-15 MED ORDER — LEVOTHYROXINE SODIUM 100 MCG PO TABS: 100.0000 ug | ORAL_TABLET | Freq: Every day | ORAL | 2 refills | Status: DC

## 2020-12-20 ENCOUNTER — Other Ambulatory Visit: Payer: Self-pay

## 2020-12-20 ENCOUNTER — Ambulatory Visit: Payer: Managed Care, Other (non HMO) | Admitting: Family Medicine

## 2020-12-20 ENCOUNTER — Encounter: Payer: Self-pay | Admitting: Family Medicine

## 2020-12-20 DIAGNOSIS — J3089 Other allergic rhinitis: Secondary | ICD-10-CM | POA: Insufficient documentation

## 2020-12-20 DIAGNOSIS — M25561 Pain in right knee: Secondary | ICD-10-CM | POA: Diagnosis not present

## 2020-12-20 DIAGNOSIS — Z720 Tobacco use: Secondary | ICD-10-CM | POA: Diagnosis not present

## 2020-12-20 DIAGNOSIS — E039 Hypothyroidism, unspecified: Secondary | ICD-10-CM

## 2020-12-20 DIAGNOSIS — G8929 Other chronic pain: Secondary | ICD-10-CM | POA: Insufficient documentation

## 2020-12-20 MED ORDER — PREDNISONE 10 MG (21) PO TBPK: ORAL_TABLET | ORAL | 0 refills | Status: DC

## 2020-12-20 MED ORDER — METHYLPREDNISOLONE ACETATE 80 MG/ML IJ SUSP
80.0000 mg | Freq: Once | INTRAMUSCULAR | Status: AC
  Administered 2020-12-20: 80 mg via INTRAMUSCULAR

## 2020-12-20 MED ORDER — LORATADINE 10 MG PO TABS: 10.0000 mg | ORAL_TABLET | Freq: Every day | ORAL | 1 refills | Status: DC

## 2020-12-20 MED ORDER — FLUTICASONE PROPIONATE 50 MCG/ACT NA SUSP: 2.0000 | Freq: Every day | NASAL | 3 refills | Status: DC

## 2020-12-20 MED ORDER — KETOROLAC TROMETHAMINE 60 MG/2ML IM SOLN
30.0000 mg | Freq: Once | INTRAMUSCULAR | Status: AC
  Administered 2020-12-20: 30 mg via INTRAMUSCULAR

## 2020-12-20 MED ORDER — BUPROPION HCL ER (XL) 300 MG PO TB24: 300.0000 mg | ORAL_TABLET | Freq: Every day | ORAL | 1 refills | Status: DC

## 2020-12-20 NOTE — Assessment & Plan Note (Signed)
Updated labs ordered today Denies S&S of hypo or hyperthyroidism.  Reports taking medications as directed.

## 2020-12-20 NOTE — Assessment & Plan Note (Signed)
Is using vaping nicotine at this time.  Smoking cessation is encouraged. Increase Wellbutrin to 300 mg.

## 2020-12-20 NOTE — Progress Notes (Signed)
Subjective:  Patient ID: [name], female    DOB: 01/06/1966  Age: 55 y.o. MRN: [id number]  CC:  Chief Complaint  Patient presents with  . Knee Pain    Right knee pain   . Sinus Problem    Has a lot of drainage that goes down her throat and a lot, makes her cough, excessive sneezing. Always has drainage but sneezing has been the last couple months       HPI  HPI  [name] is a 55 year old female patient who presents today for 63-month follow-up regarding her chronic conditions which include but not limited to arthritis, hypertension, hypothyroidism among others.  Today her biggest complaint is that she is having some right knee pain.  In addition to seasonal allergy issues.  Right knee: Chronic in nature.  Has had a procedure on it in the past.  Has 4 pins at each corner of the knee.  Does not report feeling any of the pins migrate.  She reports the pain comes and goes.  Has noticed it seems to be like arthritis as when it does not come it aches all the time.  Worse in the morning.  Sitting still and activity make it worse as well.  Exercising and stretching improvement.  Today in the office she is at 5-6 out of 10.  Denies having any falls or trauma or changes in activity level in the last couple weeks.  Allergies: Reports that she is now vaping nicotine has been going back and forth between stopping smoking cigarettes and this.  Is trying her best to have smoking cessation.  The most the people around her smokers and that makes her want to smoke.  She additionally reports that she has had a lot of drainage down her throat making her cough.  Excessive sneezing.  This has been going on for last couple months since the weather has been erratic.  Is not currently taking anything for allergies at this time.  Hypothyroidism: Needs to get updated TSH level.  Is willing to have it drawn today.  Denies having any signs and symptoms today of uncontrolled hypothyroidism.  Today patient  denies signs and symptoms of COVID 19 infection including fever, chills, cough, shortness of breath, and headache. Past Medical, Surgical, Social History, Allergies, and Medications have been Reviewed.   Past Medical History:  Diagnosis Date  . Allergy    multiple  . Arthritis    back and shoulders  . Chest pain   . Educated about COVID-19 virus infection 05/17/2020  . History of first degree AV block 06/22/2020  . History of vitamin D deficiency 06/22/2020  . Hyperlipidemia   . Hyperthyroidism   . HYPOTENSION, ORTHOSTATIC 10/29/2008   POTS   . Pedal edema 12/03/2016  . Peripheral edema   . Post-menopausal 12/03/2016  . POTS (postural orthostatic tachycardia syndrome)   . Thyroid disease   . Tobacco abuse 12/03/2016  . Wears glasses     Current Meds  Medication Sig  . acetaminophen (TYLENOL) 500 MG tablet Take 1,000 mg by mouth every 6 (six) hours as needed for mild pain or moderate pain.  . Cholecalciferol (VITAMIN D3) 125 MCG (5000 UT) CAPS Take 5,000 Units by mouth daily.  . fluticasone (FLONASE) 50 MCG/ACT nasal spray Place 2 sprays into both nostrils daily.  Marland Kitchen levothyroxine (SYNTHROID) 100 MCG tablet Take 1 tablet (100 mcg total) by mouth daily.  Marland Kitchen loratadine (CLARITIN) 10 MG tablet Take 1 tablet (10 mg  total) by mouth daily.  . predniSONE (STERAPRED UNI-PAK 21 TAB) 10 MG (21) TBPK tablet Take as directed    ROS:  Review of Systems  HENT: Positive for congestion.   Eyes: Negative.   Respiratory: Negative.   Cardiovascular: Negative.   Gastrointestinal: Negative.   Genitourinary: Negative.   Musculoskeletal: Positive for joint pain.  Skin: Negative.   Neurological: Negative.   Endo/Heme/Allergies: Negative.   Psychiatric/Behavioral: Negative.      Objective:   Today's Vitals: BP (!) 150/88   Pulse 98   Resp 15   Ht 5\' 4"  (1.626 m)   Wt 216 lb (98 kg)   LMP 09/26/2014   SpO2 98%   BMI 37.08 kg/m  Vitals with BMI 12/20/2020 07/20/2020 06/22/2020  Height 5\' 4"  5\' 4"   5\' 5"   Weight 216 lbs 209 lbs 2 oz 216 lbs 13 oz  BMI 37.06 87.56 43.32  Systolic 951 884 166  Diastolic 88 70 90  Pulse 98 93 93     Physical Exam Vitals and nursing note reviewed.  Constitutional:      Appearance: Normal appearance. She is obese.  HENT:     Head: Normocephalic and atraumatic.     Right Ear: External ear normal.     Left Ear: External ear normal.  Eyes:     General:        Right eye: No discharge.        Left eye: No discharge.     Conjunctiva/sclera: Conjunctivae normal.  Cardiovascular:     Rate and Rhythm: Normal rate and regular rhythm.     Pulses: Normal pulses.     Heart sounds: Normal heart sounds.  Pulmonary:     Effort: Pulmonary effort is normal.     Breath sounds: Normal breath sounds.  Musculoskeletal:        General: Normal range of motion.     Cervical back: Normal range of motion and neck supple.     Right knee: Normal.     Left knee: Normal.     Comments: No scar from previous surgery  MAE ROM intact in office   Skin:    General: Skin is warm.  Neurological:     General: No focal deficit present.     Mental Status: She is alert and oriented to person, place, and time.  Psychiatric:        Mood and Affect: Mood normal.        Behavior: Behavior normal.        Thought Content: Thought content normal.        Judgment: Judgment normal.    Assessment   1. Morbid obesity ([place])   2. Hypothyroidism, unspecified type   3. Environmental and seasonal allergies   4. Tobacco abuse   5. Chronic pain of right knee     Tests ordered Orders Placed This Encounter  Procedures  . CBC  . TSH  . Comprehensive metabolic panel     Plan: Please see assessment and plan per problem list above.   Meds ordered this encounter  Medications  . fluticasone (FLONASE) 50 MCG/ACT nasal spray    Sig: Place 2 sprays into both nostrils daily.    Dispense:  16 g    Refill:  3    Order Specific Question:   Supervising Provider    Answer:   SIMPSON,  MARGARET E [[id number]]  . loratadine (CLARITIN) 10 MG tablet    Sig: Take 1 tablet (10 mg total) by  mouth daily.    Dispense:  90 tablet    Refill:  1    Order Specific Question:   Supervising Provider    Answer:   SIMPSON, MARGARET E [[id number]]  . predniSONE (STERAPRED UNI-PAK 21 TAB) 10 MG (21) TBPK tablet    Sig: Take as directed    Dispense:  21 tablet    Refill:  0    Order Specific Question:   Supervising Provider    Answer:   SIMPSON, MARGARET E [[id number]]  . ketorolac (TORADOL) injection 30 mg  . methylPREDNISolone acetate (DEPO-MEDROL) injection 80 mg  . buPROPion (WELLBUTRIN XL) 300 MG 24 hr tablet    Sig: Take 1 tablet (300 mg total) by mouth daily.    Dispense:  30 tablet    Refill:  1    Order Specific Question:   Supervising Provider    Answer:   Fayrene Helper [[id number]]    Patient to follow-up in Visit date not found   Perlie Mayo, NP

## 2020-12-20 NOTE — Assessment & Plan Note (Signed)
Deteriorated  Obesity is linked to hyperlipidemia and tobacco use  [name] is re-educated about the importance of exercise daily to help with weight management. A minumum of 30 minutes daily is recommended. Additionally, importance of healthy food choices  with portion control discussed.  Wt Readings from Last 3 Encounters:  12/20/20 216 lb (98 kg)  07/20/20 209 lb 1.9 oz (94.9 kg)  06/22/20 216 lb 12.8 oz (98.3 kg)

## 2020-12-20 NOTE — Patient Instructions (Addendum)
I appreciate the opportunity to provide you with care for your health and wellness.  Follow up:  6 months for CPE (please check date of last one prior to scheduling)  Labs today No referrals today- but if knee does not improve call and we will send a referral in for Ortho  Take medications ordered today as directed  Call if not improved after completing the prednisone dose pack  Injections for pain today  Please continue to practice social distancing to keep you, your family, and our community safe.  If you must go out, please wear a mask and practice good handwashing.  It was a pleasure to see you and I look forward to continuing to work together on your health and well-being. Please do not hesitate to call the office if you need care or have questions about your care.  Have a wonderful day. With Gratitude, Cherly Beach, DNP, AGNP-BC

## 2020-12-20 NOTE — Assessment & Plan Note (Signed)
Prescription for Flonase and Claritin and encourage smoking cessation.

## 2020-12-20 NOTE — Assessment & Plan Note (Signed)
Toradol and prednisone injections in the office.  Prednisone Dosepak provided.  If not improved will refer to Ortho.  Has full range of motion in office.

## 2020-12-21 ENCOUNTER — Other Ambulatory Visit: Payer: Self-pay

## 2020-12-21 DIAGNOSIS — E039 Hypothyroidism, unspecified: Secondary | ICD-10-CM

## 2020-12-21 LAB — CBC
Hematocrit: 42.3 % (ref 34.0–46.6)
Hemoglobin: 14.6 g/dL (ref 11.1–15.9)
MCH: 30.5 pg (ref 26.6–33.0)
MCHC: 34.5 g/dL (ref 31.5–35.7)
MCV: 88 fL (ref 79–97)
Platelets: 337 10*3/uL (ref 150–450)
RBC: 4.79 x10E6/uL (ref 3.77–5.28)
RDW: 12.1 % (ref 11.7–15.4)
WBC: 8 10*3/uL (ref 3.4–10.8)

## 2020-12-21 LAB — COMPREHENSIVE METABOLIC PANEL
ALT: 31 IU/L (ref 0–32)
AST: 18 IU/L (ref 0–40)
Albumin/Globulin Ratio: 1.9 (ref 1.2–2.2)
Albumin: 4.3 g/dL (ref 3.8–4.9)
Alkaline Phosphatase: 109 IU/L (ref 44–121)
BUN/Creatinine Ratio: 12 (ref 9–23)
BUN: 9 mg/dL (ref 6–24)
Bilirubin Total: 0.4 mg/dL (ref 0.0–1.2)
CO2: 25 mmol/L (ref 20–29)
Calcium: 9 mg/dL (ref 8.7–10.2)
Chloride: 102 mmol/L (ref 96–106)
Creatinine, Ser: 0.78 mg/dL (ref 0.57–1.00)
Globulin, Total: 2.3 g/dL (ref 1.5–4.5)
Glucose: 95 mg/dL (ref 65–99)
Potassium: 4.5 mmol/L (ref 3.5–5.2)
Sodium: 139 mmol/L (ref 134–144)
Total Protein: 6.6 g/dL (ref 6.0–8.5)
eGFR: 91 mL/min/{1.73_m2} (ref >59)

## 2020-12-21 LAB — TSH: TSH: 4.31 u[IU]/mL (ref 0.450–4.500)

## 2020-12-21 MED ORDER — LEVOTHYROXINE SODIUM 100 MCG PO TABS: 100.0000 ug | ORAL_TABLET | Freq: Every day | ORAL | 3 refills | Status: DC

## 2020-12-28 ENCOUNTER — Ambulatory Visit: Payer: Managed Care, Other (non HMO) | Admitting: Family Medicine

## 2021-01-11 ENCOUNTER — Other Ambulatory Visit: Payer: Self-pay | Admitting: Family Medicine

## 2021-01-11 DIAGNOSIS — Z720 Tobacco use: Secondary | ICD-10-CM

## 2021-05-22 ENCOUNTER — Encounter: Payer: Self-pay | Admitting: Nurse Practitioner

## 2021-05-22 ENCOUNTER — Other Ambulatory Visit: Payer: Self-pay

## 2021-05-22 ENCOUNTER — Ambulatory Visit (INDEPENDENT_AMBULATORY_CARE_PROVIDER_SITE_OTHER): Payer: Managed Care, Other (non HMO) | Admitting: Nurse Practitioner

## 2021-05-22 VITALS — BP 138/85 | HR 90 | Temp 97.6°F | Ht 65.0 in | Wt 220.0 lb

## 2021-05-22 DIAGNOSIS — E785 Hyperlipidemia, unspecified: Secondary | ICD-10-CM

## 2021-05-22 DIAGNOSIS — G8929 Other chronic pain: Secondary | ICD-10-CM

## 2021-05-22 DIAGNOSIS — M25561 Pain in right knee: Secondary | ICD-10-CM

## 2021-05-22 DIAGNOSIS — Z0000 Encounter for general adult medical examination without abnormal findings: Secondary | ICD-10-CM | POA: Diagnosis not present

## 2021-05-22 DIAGNOSIS — E039 Hypothyroidism, unspecified: Secondary | ICD-10-CM

## 2021-05-22 NOTE — Progress Notes (Signed)
Established Patient Office Visit  Subjective:  Patient ID: [name], female    DOB: [birth year]  Age: 55 y.o. MRN: [id number]  CC:  Chief Complaint  Patient presents with   Knee Pain    Ongoing R knee pain, has been going on for several months.    HPI [name] presents for right knee pain. This has been ongoing for several months. She has ACL repair in 1987. She recently stopped smoking and states she has been gaining weight.   Past Medical History:  Diagnosis Date   Allergy    multiple   Arthritis    back and shoulders   Chest pain    Educated about COVID-19 virus infection 05/17/2020   History of first degree AV block 06/22/2020   History of vitamin D deficiency 06/22/2020   Hyperlipidemia    Hyperthyroidism    HYPOTENSION, ORTHOSTATIC 10/29/2008   POTS    Pedal edema 12/03/2016   Peripheral edema    Post-menopausal 12/03/2016   POTS (postural orthostatic tachycardia syndrome)    Thyroid disease    Tobacco abuse 12/03/2016   Wears glasses     Past Surgical History:  Procedure Laterality Date   ARTHROSCOPIC REPAIR ACL  1986   right knee   KNEE ARTHROPLASTY     MASS EXCISION Right 09/30/2014   Procedure: 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;  Surgeon: Daryll Brod, MD;  Location: [facility];  Service: Orthopedics;  Laterality: Right;   THYROID SURGERY     THYROIDECTOMY  2006   TILT TABLE STUDY  2007    Family History  Problem Relation Age of Onset   Heart disease Mother        stents 102   Asthma Mother    Hearing loss Mother    Hypertension Mother    Heart disease Father    Alzheimer's disease Father        died at 13   Alcohol abuse Father    Cancer Father        leukemia   Cancer Paternal Aunt     Social History   Socioeconomic History   Marital status: Married    Spouse name: Not on file   Number of children: 0   Years of education: 13   Highest education level: Associate degree: occupational,  Hotel manager, or vocational program  Occupational History   Occupation: parts specialist     Comment: [employer]   Tobacco Use   Smoking status: Former    Packs/day: 0.25    Years: 26.00    Pack years: 6.50    Types: Cigarettes    Start date: 10/15/1990    Quit date: 12/10/2018    Years since quitting: 2.4   Smokeless tobacco: Never  Vaping Use   Vaping Use: Never used  Substance and Sexual Activity   Alcohol use: No    Comment: very rare   Drug use: No   Sexual activity: Never  Other Topics Concern   Not on file  Social History Narrative   Divorced   Lives ex husband-relationship is good friends   Metallurgist   [employer]      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     Cholecalciferol (VITAMIN D3) 125 MCG (5000 UT) CAPS Take 5,000 Units by mouth daily.     levothyroxine (SYNTHROID) 100 MCG tablet Take 1 tablet (100 mcg total) by mouth daily. 30 tablet 3   buPROPion (WELLBUTRIN XL) 300 MG 24 hr tablet TAKE 1 TABLET BY MOUTH EVERY DAY (Patient not taking: Reported on 05/22/2021) 90 tablet 1   fluticasone (FLONASE) 50 MCG/ACT nasal spray Place 2 sprays into both nostrils daily. (Patient not taking: Reported on 05/22/2021) 16 g 3   loratadine (CLARITIN) 10 MG tablet Take 1 tablet (10 mg total) by mouth daily. 90 tablet 1   predniSONE (STERAPRED UNI-PAK 21 TAB) 10 MG (21) TBPK tablet Take as directed 21 tablet 0   No facility-administered medications prior to visit.    Allergies   Allergen Reactions   Other Other (See Comments)    Ink (Hives,Rash) Rubber ("big blisters that bust") Leather (Hives,Rash)   Penicillins Other (See Comments) and Hives    REACTION: unknown/childhood   Latex Rash    REACTION: Also allergic to all rubbers, leathers with moisture and direct contact     ROS Review of Systems  Constitutional: Negative.   Respiratory: Negative.    Cardiovascular: Negative.   Musculoskeletal:        Right knee pain  Psychiatric/Behavioral: Negative.       Objective:    Physical Exam Constitutional:      Appearance: Normal appearance. She is obese.  Cardiovascular:     Rate and Rhythm: Normal rate and regular rhythm.     Pulses: Normal pulses.     Heart sounds: Normal heart sounds.  Pulmonary:     Effort: Pulmonary effort is normal.     Breath sounds: Normal breath sounds.  Musculoskeletal:        General: Signs of injury present.     Comments: Surgical scar to right knee; superficial bruising distal to knee -she has medial pain with valgus stress  Neurological:     Mental Status: She is alert.    BP 138/85 (BP Location: Left Arm, Patient Position: Sitting, Cuff Size: Large)   Pulse 90   Temp 97.6 F (36.4 C) (Temporal)   Ht 5' 5"  (1.651 m)   Wt 220 lb (99.8 kg)   LMP 09/26/2014   SpO2 97%   BMI 36.61 kg/m  Wt Readings from Last 3 Encounters:  05/22/21 220 lb (99.8 kg)  12/20/20 216 lb (98 kg)  07/20/20 209 lb 1.9 oz (94.9 kg)     Health Maintenance Due  Topic Date Due   Zoster Vaccines- Shingrix (1 of 2) Never done   INFLUENZA VACCINE  05/15/2021    There are no preventive care reminders to display for this patient.  Lab Results  Component Value Date   TSH 4.310 12/20/2020   Lab Results  Component Value Date   WBC 8.0 12/20/2020   HGB 14.6 12/20/2020   HCT 42.3 12/20/2020   MCV 88 12/20/2020   PLT 337 12/20/2020   Lab Results  Component Value Date   NA 139 12/20/2020   K 4.5 12/20/2020   CO2 25 12/20/2020    GLUCOSE 95 12/20/2020   BUN 9 12/20/2020   CREATININE 0.78  12/20/2020   BILITOT 0.4 12/20/2020   ALKPHOS 109 12/20/2020   AST 18 12/20/2020   ALT 31 12/20/2020   PROT 6.6 12/20/2020   ALBUMIN 4.3 12/20/2020   CALCIUM 9.0 12/20/2020   ANIONGAP 11 06/27/2020   EGFR 91 12/20/2020   Lab Results  Component Value Date   CHOL 153 06/27/2020   Lab Results  Component Value Date   HDL 52 06/27/2020   Lab Results  Component Value Date   LDLCALC 91 06/27/2020   Lab Results  Component Value Date   TRIG 50 06/27/2020   Lab Results  Component Value Date   CHOLHDL 2.9 06/27/2020   Lab Results  Component Value Date   HGBA1C 5.6 06/27/2020      Assessment & Plan:   Problem List Items Addressed This Visit       Endocrine   Hypothyroidism    -checking thyroid labs today       Relevant Orders   TSH + free T4     Other   Hyperlipidemia    -check lipids with next set of labs, she is not fasting today       Chronic pain of right knee    -medial knee pain with valgus stress -she had significant surgery in the past -referral to ortho       Relevant Orders   Ambulatory referral to Orthopedic Surgery   Other Visit Diagnoses     Routine general medical examination at a health care facility    -  Primary   Relevant Orders   CBC with Differential/Platelet   CMP14+EGFR   Lipid Panel With LDL/HDL Ratio       No orders of the defined types were placed in this encounter.   Follow-up: Return in about 3 months (around 08/22/2021) for Physical Exam.    Noreene Larsson, NP

## 2021-05-22 NOTE — Assessment & Plan Note (Signed)
-  medial knee pain with valgus stress -she had significant surgery in the past -referral to ortho

## 2021-05-22 NOTE — Assessment & Plan Note (Signed)
-  check lipids with next set of labs, she is not fasting today

## 2021-05-22 NOTE — Patient Instructions (Signed)
Please have thyroid labs drawn today.  We will complete a physical at your next appointment.  Please have fasting labs drawn 2-3 days prior to your appointment so we can discuss the results during your office visit.

## 2021-05-22 NOTE — Assessment & Plan Note (Signed)
-  checking thyroid labs today

## 2021-05-24 ENCOUNTER — Encounter: Payer: Self-pay | Admitting: Orthopaedic Surgery

## 2021-05-24 LAB — TSH+FREE T4
Free T4: 1.5 ng/dL (ref 0.82–1.77)
TSH: 2.39 u[IU]/mL (ref 0.450–4.500)

## 2021-05-24 NOTE — Progress Notes (Signed)
Thyroid labs are WNL, so no need to change meds.

## 2021-05-25 ENCOUNTER — Other Ambulatory Visit: Payer: Self-pay | Admitting: Nurse Practitioner

## 2021-05-25 DIAGNOSIS — E039 Hypothyroidism, unspecified: Secondary | ICD-10-CM

## 2021-05-25 MED ORDER — LEVOTHYROXINE SODIUM 100 MCG PO TABS: 100.0000 ug | ORAL_TABLET | Freq: Every day | ORAL | 3 refills | Status: DC

## 2021-06-05 ENCOUNTER — Other Ambulatory Visit: Payer: Self-pay

## 2021-06-05 ENCOUNTER — Encounter: Payer: Self-pay | Admitting: Orthopedic Surgery

## 2021-06-05 ENCOUNTER — Ambulatory Visit: Payer: Managed Care, Other (non HMO)

## 2021-06-05 ENCOUNTER — Ambulatory Visit (INDEPENDENT_AMBULATORY_CARE_PROVIDER_SITE_OTHER): Payer: Managed Care, Other (non HMO) | Admitting: Orthopedic Surgery

## 2021-06-05 VITALS — BP 163/101 | HR 81 | Ht 65.0 in | Wt 216.0 lb

## 2021-06-05 DIAGNOSIS — G8929 Other chronic pain: Secondary | ICD-10-CM

## 2021-06-05 DIAGNOSIS — M25561 Pain in right knee: Secondary | ICD-10-CM

## 2021-06-05 NOTE — Patient Instructions (Signed)
While we are working on your approval for MRI please go ahead and call to schedule your appointment with [facility] within at least one (1) week.   Central Scheduling [phone]  

## 2021-06-05 NOTE — Progress Notes (Signed)
New patient new problem   Chief Complaint  Patient presents with   Knee Pain    Right for a couple months    55 year old female status post ACL reconstruction in 1987 with a combined open and arthroscopic reconstruction.  She did well until about 2 months ago when she started having medial pain catching sensation and a feeling like her knee would give out   Body mass index is 35.94 kg/m.  BP (!) 163/101   Pulse 81   Ht '5\' 5"'$  (1.651 m)   Wt 216 lb (98 kg)   LMP 09/26/2014   BMI 35.94 kg/m   Past Medical History:  Diagnosis Date   Allergy    multiple   Arthritis    back and shoulders   Chest pain    Educated about COVID-19 virus infection 05/17/2020   History of first degree AV block 06/22/2020   History of vitamin D deficiency 06/22/2020   Hyperlipidemia    Hyperthyroidism    HYPOTENSION, ORTHOSTATIC 10/29/2008   POTS    Pedal edema 12/03/2016   Peripheral edema    Post-menopausal 12/03/2016   POTS (postural orthostatic tachycardia syndrome)    Thyroid disease    Tobacco abuse 12/03/2016   Wears glasses     Physical Exam  Constitutional: Development normal, nutrition normal, body habitus Body mass index is 35.94 kg/m.  Mental status oriented x3 mood and affect no depression Cardiovascular pulses and temperature normal   Gait normal  Musculoskeletal:  Inspection: Right knee large incision over the front of the knee no issues there tenderness medial joint line no effusion Range of motion: I detect a 1 or 2 degree flexion contracture about 125 degree knee flexion Stability: Anterior drawer test posterior drawer test negative Muscle strength and tone: Normal  Skin warm dry and intact no erythema  Neurological sensation normal  Assessment and plan:  X-rays there is a staple on the lateral femoral condyle for fixation we see the tibial tunnel as well  There is mild arthritis in the tibiofemoral joints and moderate arthritis in the patellofemoral  joint  Recommend MRI to evaluate the medial meniscus for tear

## 2021-06-07 ENCOUNTER — Encounter: Payer: Self-pay | Admitting: Nurse Practitioner

## 2021-06-07 ENCOUNTER — Other Ambulatory Visit: Payer: Self-pay

## 2021-06-07 ENCOUNTER — Ambulatory Visit (INDEPENDENT_AMBULATORY_CARE_PROVIDER_SITE_OTHER): Payer: Managed Care, Other (non HMO) | Admitting: Nurse Practitioner

## 2021-06-07 DIAGNOSIS — R0609 Other forms of dyspnea: Secondary | ICD-10-CM | POA: Insufficient documentation

## 2021-06-07 DIAGNOSIS — R0600 Dyspnea, unspecified: Secondary | ICD-10-CM | POA: Diagnosis not present

## 2021-06-07 DIAGNOSIS — I10 Essential (primary) hypertension: Secondary | ICD-10-CM

## 2021-06-07 HISTORY — DX: Essential (primary) hypertension: I10

## 2021-06-07 MED ORDER — LISINOPRIL 5 MG PO TABS: 5.0000 mg | ORAL_TABLET | Freq: Every day | ORAL | 3 refills | Status: DC

## 2021-06-07 NOTE — Progress Notes (Signed)
Acute Office Visit  Subjective:    Patient ID: [name], female    DOB: May 03, 1966, 55 y.o.   MRN: [id number]  Chief Complaint  Patient presents with   Follow-up    Follow up BP and heart issues.    HPI Patient is in today for elevated BP. She states she gets out of breath when she is walking places.  She states her BP was elevated at the ortho office.  Past Medical History:  Diagnosis Date   Allergy    multiple   Arthritis    back and shoulders   Chest pain    Educated about COVID-19 virus infection 05/17/2020   History of first degree AV block 06/22/2020   History of vitamin D deficiency 06/22/2020   HTN (hypertension) 06/07/2021   Hyperlipidemia    Hyperthyroidism    HYPOTENSION, ORTHOSTATIC 10/29/2008   POTS    Pedal edema 12/03/2016   Peripheral edema    Post-menopausal 12/03/2016   POTS (postural orthostatic tachycardia syndrome)    Thyroid disease    Tobacco abuse 12/03/2016   Wears glasses     Past Surgical History:  Procedure Laterality Date   ARTHROSCOPIC REPAIR ACL  1986   right knee   KNEE ARTHROPLASTY     MASS EXCISION Right 09/30/2014   Procedure: 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;  Surgeon: Daryll Brod, MD;  Location: [facility];  Service: Orthopedics;  Laterality: Right;   THYROID SURGERY     THYROIDECTOMY  2006   TILT TABLE STUDY  2007    Family History  Problem Relation Age of Onset   Heart disease Mother        stents 16   Asthma Mother    Hearing loss Mother    Hypertension Mother    Heart disease Father    Alzheimer's disease Father        died at 27   Alcohol abuse Father    Cancer Father        leukemia   Cancer Paternal Aunt     Social History   Socioeconomic History   Marital status: Married    Spouse name: Not on file   Number of children: 0   Years of education: 13   Highest education level: Associate degree: occupational, Hotel manager, or vocational program  Occupational  History   Occupation: parts specialist     Comment: [employer]   Tobacco Use   Smoking status: Former    Packs/day: 0.25    Years: 26.00    Pack years: 6.50    Types: Cigarettes    Start date: 10/15/1990    Quit date: 03/26/2021    Years since quitting: 0.2   Smokeless tobacco: Never  Vaping Use   Vaping Use: Never used  Substance and Sexual Activity   Alcohol use: No    Comment: very rare   Drug use: No   Sexual activity: Never  Other Topics Concern   Not on file  Social History Narrative   Divorced   Lives ex husband-relationship is good friends   Metallurgist   [employer]      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     Cholecalciferol (VITAMIN D3) 125 MCG (5000 UT) CAPS Take 5,000 Units by mouth daily.     levothyroxine (SYNTHROID) 100 MCG tablet Take 1 tablet (100 mcg total) by mouth daily. 30 tablet 3   No facility-administered medications prior to visit.    Allergies  Allergen Reactions   Other Other (See Comments)    Ink (Hives,Rash) Rubber ("big blisters that bust") Leather (Hives,Rash)   Penicillins Other (See Comments) and Hives    REACTION: unknown/childhood   Latex Rash    REACTION: Also allergic to all rubbers, leathers with moisture and direct contact     Review of Systems  Constitutional: Negative.   Respiratory:  Positive for shortness of breath.        With exertion  Cardiovascular:  Positive for palpitations.  Musculoskeletal: Negative.    Psychiatric/Behavioral: Negative.        Objective:    Physical Exam Constitutional:      Appearance: Normal appearance.  Cardiovascular:     Rate and Rhythm: Normal rate and regular rhythm.     Pulses: Normal pulses.     Heart sounds: Normal heart sounds.  Pulmonary:     Effort: Pulmonary effort is normal.     Breath sounds: Normal breath sounds.  Musculoskeletal:        General: Normal range of motion.  Neurological:     Mental Status: She is alert.  Psychiatric:        Mood and Affect: Mood normal.        Behavior: Behavior normal.        Thought Content: Thought content normal.        Judgment: Judgment normal.    BP 137/80   Pulse 88   Temp 98.5 F (36.9 C)   Resp 18   Ht 5' 5" (1.651 m)   Wt 216 lb 1.9 oz (98 kg)   LMP 09/26/2014   SpO2 97%   BMI 35.96 kg/m  Wt Readings from Last 3 Encounters:  06/07/21 216 lb 1.9 oz (98 kg)  06/05/21 216 lb (98 kg)  05/22/21 220 lb (99.8 kg)    Health Maintenance Due  Topic Date Due   Zoster Vaccines- Shingrix (1 of 2) Never done   INFLUENZA VACCINE  05/15/2021    There are no preventive care reminders to display for this patient.   Lab Results  Component Value Date   TSH 2.390 05/22/2021   Lab Results  Component Value Date   WBC 8.0 12/20/2020   HGB 14.6 12/20/2020   HCT 42.3 12/20/2020   MCV 88 12/20/2020   PLT 337 12/20/2020   Lab Results  Component Value Date   NA 139 12/20/2020   K 4.5 12/20/2020   CO2 25 12/20/2020   GLUCOSE 95 12/20/2020   BUN 9 12/20/2020   CREATININE 0.78 12/20/2020   BILITOT 0.4 12/20/2020   ALKPHOS 109 12/20/2020   AST 18 12/20/2020   ALT 31 12/20/2020   PROT 6.6 12/20/2020   ALBUMIN 4.3 12/20/2020   CALCIUM 9.0 12/20/2020   ANIONGAP 11 06/27/2020   EGFR 91 12/20/2020   Lab Results  Component Value Date   CHOL 153 06/27/2020   Lab Results  Component Value Date   HDL   52 06/27/2020   Lab Results  Component Value Date   LDLCALC 91 06/27/2020   Lab Results   Component Value Date   TRIG 50 06/27/2020   Lab Results  Component Value Date   CHOLHDL 2.9 06/27/2020   Lab Results  Component Value Date   HGBA1C 5.6 06/27/2020       Assessment & Plan:   Problem List Items Addressed This Visit       Cardiovascular and Mediastinum   HTN (hypertension)    BP Readings from Last 3 Encounters:  06/07/21 137/80  06/05/21 (!) 163/101  05/22/21 138/85  -BP well controlled today -she will check BPs at home -Rx. lisinopril -f/u in  1 month      Relevant Medications   lisinopril (ZESTRIL) 5 MG tablet     Other   Exertional dyspnea    -occasional SOB/palpitaions with exertions- she says they feel like a "kiss me quick" or "going over a speed bump" that occurs infrequently -she would like cardiology consult, will honor that request -no acute distress      Relevant Orders   Ambulatory referral to Cardiology     Meds ordered this encounter  Medications   lisinopril (ZESTRIL) 5 MG tablet    Sig: Take 1 tablet (5 mg total) by mouth daily.    Dispense:  90 tablet    Refill:  [place], NP

## 2021-06-07 NOTE — Assessment & Plan Note (Addendum)
-  occasional SOB/palpitaions with exertions- she says they feel like a "kiss me quick" or "going over a speed bump" that occurs infrequently -she would like cardiology consult, will honor that request -no acute distress

## 2021-06-07 NOTE — Assessment & Plan Note (Signed)
BP Readings from Last 3 Encounters:  06/07/21 137/80  06/05/21 (!) 163/101  05/22/21 138/85   -BP well controlled today -she will check BPs at home -Rx. lisinopril -f/u in  1 month

## 2021-06-14 ENCOUNTER — Ambulatory Visit (HOSPITAL_COMMUNITY)
Admission: RE | Admit: 2021-06-14 | Discharge: 2021-06-14 | Disposition: A | Discharge status: Home / Self Care | Payer: Managed Care, Other (non HMO) | Source: Ambulatory Visit | Attending: Orthopedic Surgery | Admitting: Orthopedic Surgery

## 2021-06-14 ENCOUNTER — Other Ambulatory Visit: Payer: Self-pay

## 2021-06-14 DIAGNOSIS — G8929 Other chronic pain: Secondary | ICD-10-CM | POA: Insufficient documentation

## 2021-06-14 DIAGNOSIS — M25561 Pain in right knee: Secondary | ICD-10-CM | POA: Diagnosis not present

## 2021-06-22 ENCOUNTER — Encounter: Payer: Self-pay | Admitting: Orthopedic Surgery

## 2021-06-22 ENCOUNTER — Other Ambulatory Visit: Payer: Self-pay

## 2021-06-22 ENCOUNTER — Ambulatory Visit (INDEPENDENT_AMBULATORY_CARE_PROVIDER_SITE_OTHER): Payer: Managed Care, Other (non HMO) | Admitting: Orthopedic Surgery

## 2021-06-22 DIAGNOSIS — M1711 Unilateral primary osteoarthritis, right knee: Secondary | ICD-10-CM | POA: Diagnosis not present

## 2021-06-22 MED ORDER — MELOXICAM 7.5 MG PO TABS: 7.5000 mg | ORAL_TABLET | Freq: Every day | ORAL | 5 refills | Status: DC

## 2021-06-22 NOTE — Progress Notes (Signed)
Chief Complaint  Patient presents with   Results    Review MRI right knee    Knee Pain    Right    [name] is here to review her MRI she still having catching and pain on the medial joint line.  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  I think she should try further nonoperative measures due to the fact that 50% of the knees with arthritis and a meniscal tear in the compartment will [place] and resolve in total knee  Total knee is also an option but has more complications which we discussed  She has not been medically managed has not had any injections either  So I put her on meloxicam once a day and gave her a cortisone injection  A steroid injection was performed at right knee using 1% plain Lidocaine and Celestone 6 mg of Celestone. This was well tolerated.   My interpretation of the MRI: After reviewing the images: The medial meniscus is torn but is a partial-thickness undersurface tear.  She also has no ACL graft tissue left.  She has 3 compartment arthrosis with full-thickness lesions throughout.  Follow-up 4 weeks

## 2021-06-22 NOTE — Patient Instructions (Signed)
Start meloxicam 1 daily   You have received an injection of steroids into the joint. 15% of patients will have increased pain within the 24 hours postinjection.   This is transient and will go away.   We recommend that you use ice packs on the injection site for 20 minutes every 2 hours and extra strength Tylenol 2 tablets every 8 as needed until the pain resolves.  If you continue to have pain after taking the Tylenol and using the ice please call the office for further instructions.

## 2021-06-29 ENCOUNTER — Ambulatory Visit: Payer: Managed Care, Other (non HMO) | Admitting: Orthopedic Surgery

## 2021-07-04 ENCOUNTER — Ambulatory Visit: Payer: Managed Care, Other (non HMO) | Admitting: Nurse Practitioner

## 2021-07-20 ENCOUNTER — Encounter: Payer: Self-pay | Admitting: Orthopedic Surgery

## 2021-07-20 ENCOUNTER — Ambulatory Visit (INDEPENDENT_AMBULATORY_CARE_PROVIDER_SITE_OTHER): Payer: Managed Care, Other (non HMO) | Admitting: Orthopedic Surgery

## 2021-07-20 ENCOUNTER — Other Ambulatory Visit: Payer: Self-pay

## 2021-07-20 VITALS — BP 136/96 | HR 99 | Ht 65.0 in | Wt 214.0 lb

## 2021-07-20 DIAGNOSIS — G8929 Other chronic pain: Secondary | ICD-10-CM

## 2021-07-20 DIAGNOSIS — M1711 Unilateral primary osteoarthritis, right knee: Secondary | ICD-10-CM

## 2021-07-20 DIAGNOSIS — M25561 Pain in right knee: Secondary | ICD-10-CM

## 2021-07-20 NOTE — Progress Notes (Signed)
Chief Complaint  Patient presents with   Knee Pain    Rt knee pain is better after injection. States she does feel a bubble in the knee every now and then that causes pain.    55 year old female with a history of ACL reconstruction back in the 80s had an MRI which shows 3 compartment arthrosis dissolution of the ACL graft undersurface tear of the posterior horn of the medial meniscus  I gave her a cortisone injection in the knee joint with Celestone and put her on meloxicam and she did get better as an occasional episode of swelling which responds well to meloxicam which she now only takes on an intermittent basis  The knee looks good today her motion has improved she has no palpable tenderness or joint effusion  She will continue her anti-inflammatories on an as-needed basis call us when symptoms warrant

## 2021-07-21 ENCOUNTER — Emergency Department (HOSPITAL_COMMUNITY): Payer: Managed Care, Other (non HMO)

## 2021-07-21 ENCOUNTER — Other Ambulatory Visit: Payer: Self-pay

## 2021-07-21 ENCOUNTER — Encounter (HOSPITAL_COMMUNITY): Payer: Self-pay | Admitting: *Deleted

## 2021-07-21 ENCOUNTER — Emergency Department (HOSPITAL_COMMUNITY)
Admission: EM | Admit: 2021-07-21 | Discharge: 2021-07-21 | Disposition: A | Discharge status: Home / Self Care | Payer: Managed Care, Other (non HMO) | Attending: Emergency Medicine | Admitting: Emergency Medicine

## 2021-07-21 DIAGNOSIS — Z96659 Presence of unspecified artificial knee joint: Secondary | ICD-10-CM | POA: Insufficient documentation

## 2021-07-21 DIAGNOSIS — Z79899 Other long term (current) drug therapy: Secondary | ICD-10-CM | POA: Insufficient documentation

## 2021-07-21 DIAGNOSIS — R002 Palpitations: Secondary | ICD-10-CM | POA: Insufficient documentation

## 2021-07-21 DIAGNOSIS — Z87891 Personal history of nicotine dependence: Secondary | ICD-10-CM | POA: Insufficient documentation

## 2021-07-21 DIAGNOSIS — E039 Hypothyroidism, unspecified: Secondary | ICD-10-CM | POA: Insufficient documentation

## 2021-07-21 DIAGNOSIS — Z91040 Latex allergy status: Secondary | ICD-10-CM | POA: Insufficient documentation

## 2021-07-21 DIAGNOSIS — R072 Precordial pain: Secondary | ICD-10-CM | POA: Diagnosis present

## 2021-07-21 DIAGNOSIS — R079 Chest pain, unspecified: Secondary | ICD-10-CM

## 2021-07-21 DIAGNOSIS — I10 Essential (primary) hypertension: Secondary | ICD-10-CM | POA: Insufficient documentation

## 2021-07-21 DIAGNOSIS — R5383 Other fatigue: Secondary | ICD-10-CM | POA: Insufficient documentation

## 2021-07-21 LAB — CBC WITH DIFFERENTIAL/PLATELET
Abs Immature Granulocytes: 0.02 10*3/uL (ref 0.00–0.07)
Basophils Absolute: 0.1 10*3/uL (ref 0.0–0.1)
Basophils Relative: 1 %
Eosinophils Absolute: 0.3 10*3/uL (ref 0.0–0.5)
Eosinophils Relative: 3 %
HCT: 44.2 % (ref 36.0–46.0)
Hemoglobin: 15.2 g/dL — ABNORMAL HIGH (ref 12.0–15.0)
Immature Granulocytes: 0 %
Lymphocytes Relative: 33 %
Lymphs Abs: 2.9 10*3/uL (ref 0.7–4.0)
MCH: 31.3 pg (ref 26.0–34.0)
MCHC: 34.4 g/dL (ref 30.0–36.0)
MCV: 91.1 fL (ref 80.0–100.0)
Monocytes Absolute: 0.5 10*3/uL (ref 0.1–1.0)
Monocytes Relative: 6 %
Neutro Abs: 5 10*3/uL (ref 1.7–7.7)
Neutrophils Relative %: 57 %
Platelets: 324 10*3/uL (ref 150–400)
RBC: 4.85 MIL/uL (ref 3.87–5.11)
RDW: 11.9 % (ref 11.5–15.5)
WBC: 8.8 10*3/uL (ref 4.0–10.5)
nRBC: 0 % (ref 0.0–0.2)

## 2021-07-21 LAB — TROPONIN I (HIGH SENSITIVITY)
Troponin I (High Sensitivity): 3 ng/L (ref <18)
Troponin I (High Sensitivity): <2 ng/L (ref <18)

## 2021-07-21 LAB — BASIC METABOLIC PANEL
Anion gap: 8 (ref 5–15)
BUN: 14 mg/dL (ref 6–20)
CO2: 25 mmol/L (ref 22–32)
Calcium: 9.1 mg/dL (ref 8.9–10.3)
Chloride: 105 mmol/L (ref 98–111)
Creatinine, Ser: 0.82 mg/dL (ref 0.44–1.00)
GFR, Estimated: >60 mL/min (ref >60)
Glucose, Bld: 92 mg/dL (ref 70–99)
Potassium: 4 mmol/L (ref 3.5–5.1)
Sodium: 138 mmol/L (ref 135–145)

## 2021-07-21 NOTE — ED Triage Notes (Signed)
Pt states she has chest pain intermittently and is to follow up with cardiology on 11/7 but pt states this am when she woke up she started having severe left shoulder pain

## 2021-07-21 NOTE — Discharge Instructions (Addendum)
You are seen in the emergency department for intermittent chest pain palpitations fatigue.  You had blood work EKG and a chest x-ray that did not show any evidence of heart injury.  Please follow-up with your primary care doctor and your cardiologist.  Continue your regular medication.  Return to the emergency department if any worsening or concerning symptoms

## 2021-07-21 NOTE — ED Provider Notes (Signed)
[facility] EMERGENCY DEPARTMENT Provider Note   CSN: [id number] Arrival date & time: 07/21/21  1142     History Chief Complaint  Patient presents with   Chest Pain    [name] is a 55 y.o. female.  She was recently diagnosed with hypertension a few months ago and got started on some medication for the.  Since then she has noticed on and off chest tightness pressure.  Sometimes feels her heart fluttering.  This morning when she woke up she had some achiness in her left shoulder.  She does have arthritis there but she wanted to make sure this was not a heart issue.  She has no known coronary disease but does suffer from POTS and has seen Dr. Harrington Challenger in the past.  She has another appointment with Dr. Harrington Challenger next month.  She did not feel like she could wait.  The history is provided by the patient.  Chest Pain Pain location:  Substernal area Pain quality: pressure   Pain radiates to:  Does not radiate Pain severity:  Mild Onset quality:  Gradual Duration:  2 months Timing:  Sporadic Progression:  Unchanged Chronicity:  New Relieved by:  None tried Worsened by:  Nothing Ineffective treatments:  None tried Associated symptoms: fatigue and palpitations   Associated symptoms: no abdominal pain, no back pain, no cough, no diaphoresis, no fever, no headache, no nausea, no shortness of breath and no vomiting   Risk factors: high cholesterol and hypertension       Past Medical History:  Diagnosis Date   Allergy    multiple   Arthritis    back and shoulders   Chest pain    Educated about COVID-19 virus infection 05/17/2020   History of first degree AV block 06/22/2020   History of vitamin D deficiency 06/22/2020   HTN (hypertension) 06/07/2021   Hyperlipidemia    Hyperthyroidism    HYPOTENSION, ORTHOSTATIC 10/29/2008   POTS    Pedal edema 12/03/2016   Peripheral edema    Post-menopausal 12/03/2016   POTS (postural orthostatic tachycardia syndrome)    Thyroid disease    Tobacco abuse  12/03/2016   Wears glasses     Patient Active Problem List   Diagnosis Date Noted   HTN (hypertension) 06/07/2021   Exertional dyspnea 06/07/2021   Environmental and seasonal allergies 12/20/2020   Chronic pain of right knee 12/20/2020   Annual visit for general adult medical examination with abnormal findings 06/22/2020   Morbid obesity ([place]) 06/22/2020   Screening mammogram, encounter for 06/22/2020   Encounter for screening for malignant neoplasm of colon 06/22/2020   Hypothyroidism 12/03/2016   Hyperlipidemia 12/03/2016    Past Surgical History:  Procedure Laterality Date   ARTHROSCOPIC REPAIR ACL  1986   right knee   KNEE ARTHROPLASTY     MASS EXCISION Right 09/30/2014   Procedure: 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;  Surgeon: Daryll Brod, MD;  Location: [facility];  Service: Orthopedics;  Laterality: Right;   THYROID SURGERY     THYROIDECTOMY  2006   TILT TABLE STUDY  2007     OB History   No obstetric history on file.     Family History  Problem Relation Age of Onset   Heart disease Mother        stents 105   Asthma Mother    Hearing loss Mother    Hypertension Mother    Heart disease Father    Alzheimer's disease Father  died at 71   Alcohol abuse Father    Cancer Father        leukemia   Cancer Paternal Aunt     Social History   Tobacco Use   Smoking status: Former    Packs/day: 0.25    Years: 26.00    Pack years: 6.50    Types: Cigarettes    Start date: 10/15/1990    Quit date: 03/26/2021    Years since quitting: 0.3   Smokeless tobacco: Never  Vaping Use   Vaping Use: Never used  Substance Use Topics   Alcohol use: No    Comment: very rare   Drug use: No    Home Medications Prior to Admission medications   Medication Sig Start Date End Date Taking? Authorizing Provider  acetaminophen (TYLENOL) 500 MG tablet Take 1,000 mg by mouth every 6 (six) hours as needed for mild pain or moderate  pain.    [provider]  Cholecalciferol (VITAMIN D3) 125 MCG (5000 UT) CAPS Take 5,000 Units by mouth daily.    [provider]  levothyroxine (SYNTHROID) 100 MCG tablet Take 1 tablet (100 mcg total) by mouth daily. 05/25/21   Noreene Larsson, NP  lisinopril (ZESTRIL) 5 MG tablet Take 1 tablet (5 mg total) by mouth daily. 06/07/21   Noreene Larsson, NP  meloxicam (MOBIC) 7.5 MG tablet Take 1 tablet (7.5 mg total) by mouth daily. Patient not taking: Reported on 07/20/2021 06/22/21   Carole Civil, MD    Allergies    Other, Penicillins, and Latex  Review of Systems   Review of Systems  Constitutional:  Positive for fatigue. Negative for diaphoresis and fever.  HENT:  Negative for sore throat.   Eyes:  Negative for visual disturbance.  Respiratory:  Negative for cough and shortness of breath.   Cardiovascular:  Positive for chest pain and palpitations.  Gastrointestinal:  Negative for abdominal pain, nausea and vomiting.  Genitourinary:  Negative for dysuria.  Musculoskeletal:  Negative for back pain.  Skin:  Negative for rash.  Neurological:  Negative for headaches.   Physical Exam Updated Vital Signs BP (!) 148/81 (BP Location: Right Arm)   Pulse 90   Temp 98.2 F (36.8 C) (Oral)   Resp 14   Ht 5\' 5"  (1.651 m)   Wt 97.1 kg   LMP 09/26/2014   SpO2 100%   BMI 35.61 kg/m   Physical Exam Vitals and nursing note reviewed.  Constitutional:      General: She is not in acute distress.    Appearance: She is well-developed.  HENT:     Head: Normocephalic and atraumatic.  Eyes:     Conjunctiva/sclera: Conjunctivae normal.  Cardiovascular:     Rate and Rhythm: Normal rate and regular rhythm.     Heart sounds: Normal heart sounds. No murmur heard. Pulmonary:     Effort: Pulmonary effort is normal. No respiratory distress.     Breath sounds: Normal breath sounds.  Abdominal:     Palpations: Abdomen is soft.     Tenderness: There is no abdominal tenderness.   Musculoskeletal:        General: Normal range of motion.     Cervical back: Neck supple.     Right lower leg: No tenderness. No edema.     Left lower leg: No tenderness. No edema.  Skin:    General: Skin is warm and dry.  Neurological:     General: No focal deficit present.  Mental Status: She is alert.    ED Results / Procedures / Treatments   Labs (all labs ordered are listed, but only abnormal results are displayed) Labs Reviewed  CBC WITH DIFFERENTIAL/PLATELET - Abnormal; Notable for the following components:      Result Value   Hemoglobin 15.2 (*)    All other components within normal limits  BASIC METABOLIC PANEL  TROPONIN I (HIGH SENSITIVITY)  TROPONIN I (HIGH SENSITIVITY)    EKG EKG Interpretation  Date/Time:  Friday July 21 2021 11:49:07 EDT Ventricular Rate:  99 PR Interval:  156 QRS Duration: 78 QT Interval:  326 QTC Calculation: 418 R Axis:   82 Text Interpretation: Normal sinus rhythm Normal ECG No significant change since prior 9/21 Confirmed by Aletta Edouard [phone]) on 07/21/2021 11:53:22 AM  Radiology DG Chest Port 1 View  Result Date: 07/21/2021 CLINICAL DATA:  Chest pain intermittently. New development of severe left shoulder pain. EXAM: PORTABLE CHEST 1 VIEW COMPARISON:  None. FINDINGS: The heart size and mediastinal contours are within normal limits. Both lungs are clear. No pleural effusion or pneumothorax. The visualized skeletal structures are unremarkable. Surgical clips overlie the visualized left lower neck. IMPRESSION: No acute cardiopulmonary abnormality. Electronically Signed   By: Ileana Roup M.D.   On: 07/21/2021 13:01    Procedures Procedures   Medications Ordered in ED Medications - No data to display  ED Course  I have reviewed the triage vital signs and the nursing notes.  Pertinent labs & imaging results that were available during my care of the patient were reviewed by me and considered in my medical decision making  (see chart for details).    MDM Rules/Calculators/A&P                          This patient complains of chest tightness, left shoulder pain, fatigue, palpitations; this involves an extensive number of treatment Options and is a complaint that carries with it a high risk of complications and Morbidity. The differential includes ACS, arrhythmia, metabolic derangement, anemia, pneumonia, vascular, PE  I ordered, reviewed and interpreted labs, which included CBC with normal white count normal hemoglobin, chemistries normal, troponins flat  I ordered imaging studies which included chest x-ray and I independently    visualized and interpreted imaging which showed no acute findings Additional history obtained from patient's husband Previous records obtained and reviewed in epic no recent admissions  After the interventions stated above, I reevaluated the patient and found patient be hemodynamically stable satting 100% on room air.  Do not feel needs PE testing.  No evidence of ACS.  Recommended outpatient follow-up with her PCP and cardiology.  Return instructions discussed.   Final Clinical Impression(s) / ED Diagnoses Final diagnoses:  Nonspecific chest pain  Palpitations    Rx / DC Orders ED Discharge Orders     None        Hayden Rasmussen, MD 07/21/21 1749

## 2021-08-21 ENCOUNTER — Encounter: Payer: Self-pay | Admitting: Internal Medicine

## 2021-08-21 ENCOUNTER — Other Ambulatory Visit: Payer: Self-pay

## 2021-08-21 ENCOUNTER — Ambulatory Visit (INDEPENDENT_AMBULATORY_CARE_PROVIDER_SITE_OTHER): Payer: Managed Care, Other (non HMO) | Admitting: Internal Medicine

## 2021-08-21 VITALS — BP 108/66 | HR 86 | Ht 65.0 in | Wt 215.0 lb

## 2021-08-21 DIAGNOSIS — R079 Chest pain, unspecified: Secondary | ICD-10-CM | POA: Diagnosis not present

## 2021-08-21 NOTE — Progress Notes (Signed)
Cardiology Office Note   Date:  08/21/2021   ID:  [name], DOB [birth year], MRN [id number]  PCP:  Noreene Larsson, NP  Cardiologist:   Dorris Carnes, MD   Pt presents for eval fo SOB and LE edema     History of Present Illness: [name] is a 55 y.o. female with a history of POTS   I saw her last in 2017   When I saw her then she complained of some leg swelling.  It was trace when I saw her.  Echocardiogram showed LVEF was normal.  She was doing better at that time from a POTS perspective. Strong Fhx fo CAD      Patient says that she was doing good until she gets her flutters   Short lived then gone No dizziness.  She was seen in the emergency room in early October.  EKG was negative.  Blood work negative.  Since the ER visit she has had no further spells of palpitations.  Breathing has been good.  No chest pain.  No dizziness.   Diet:   [place] sausage/egg white delight    Coffee Sits at [employer]:  [place] or [employer]  Veggies  salas Administrator, [employer]  Outpatient Medications Prior to Visit  Medication Sig Dispense Refill   acetaminophen (TYLENOL) 500 MG tablet Take 1,000 mg by mouth every 6 (six) hours as needed for mild pain or moderate pain.     Cholecalciferol (VITAMIN D3) 125 MCG (5000 UT) CAPS Take 5,000 Units by mouth daily.     levothyroxine (SYNTHROID) 100 MCG tablet Take 1 tablet (100 mcg total) by mouth daily. 30 tablet 3   lisinopril (ZESTRIL) 5 MG tablet Take 1 tablet (5 mg total) by mouth daily. 90 tablet 3   meloxicam (MOBIC) 7.5 MG tablet Take 1 tablet (7.5 mg total) by mouth daily. (Patient not taking: No sig reported) 30 tablet 5   No facility-administered medications prior to visit.     Allergies:   Other, Penicillins, and Latex   Past Medical History:  Diagnosis Date   Allergy    multiple   Arthritis    back and shoulders   Chest pain    Educated about COVID-19 virus infection 05/17/2020   History of first degree AV block  06/22/2020   History of vitamin D deficiency 06/22/2020   HTN (hypertension) 06/07/2021   Hyperlipidemia    Hyperthyroidism    HYPOTENSION, ORTHOSTATIC 10/29/2008   POTS    Pedal edema 12/03/2016   Peripheral edema    Post-menopausal 12/03/2016   POTS (postural orthostatic tachycardia syndrome)    Thyroid disease    Tobacco abuse 12/03/2016   Wears glasses     Past Surgical History:  Procedure Laterality Date   ARTHROSCOPIC REPAIR ACL  1986   right knee   KNEE ARTHROPLASTY     MASS EXCISION Right 09/30/2014   Procedure: 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 Her smoking use included cigarettes. She started smoking about 30 years ago. She has a 6.50 pack-year smoking history. She has never used smokeless tobacco. She reports that she does not drink alcohol  and does not use drugs.   Family History:  The patient's family history includes Alcohol abuse in her father; Alzheimer's disease in her father; Asthma in her mother; Cancer in her father and paternal aunt; Hearing loss in her mother; Heart disease in her father and mother; Hypertension in her mother.    ROS:  Please see the history of present illness. All other systems are reviewed and  Negative to the above problem except as noted.    PHYSICAL EXAM: VS:  BP 108/66   Pulse 86   Ht 5\' 5"  (1.651 m)   Wt 215 lb (97.5 kg)   LMP 09/26/2014   SpO2 97%   BMI 35.78 kg/m   GEN: Obese 55 yo in no acute distress HEENT: normal Neck: no JVD, carotid bruits,  Cardiac: RRR; no murmurs, rubs, or gallops Respiratory:  clear to auscultation bilaterally,  GI: soft, nontender, nondistended, + BS  No hepatomegaly  MS: no deformity Moving all extremities    Skin: warm and dry, no rashbe   EKG:  EKG is not ordered today.  In ER  SR   Lipid Panel    Component Value Date/Time   CHOL 153 06/27/2020 1033   TRIG 50 06/27/2020 1033   HDL 52 06/27/2020 1033   CHOLHDL 2.9 06/27/2020 1033   VLDL 10 06/27/2020 1033   LDLCALC 91 06/27/2020 1033   LDLCALC 89 11/25/2018 1008      Wt Readings from Last 3 Encounters:  08/21/21 215 lb (97.5 kg)  07/21/21 214 lb (97.1 kg)  07/20/21 214 lb (97.1 kg)      ASSESSMENT AND PLAN:  1 palpitations.  Episodes sound like isolated skips.  She has not had any in weeks.  Reassured her.  I do not think they represent anything malignant.  If she has recurrence or they begin to bother her would set up for monitor.  Could consider at that time low-dose beta-blocker.  Hold for now.  2  POTs patient recovered from this.  No dizzy spells. 3  Tob congratulated her.  She has not resumed smoking. 4  Thyroid   TSH normal     5   Obesity  Discussed sugar, TRE.  Discussed Mediterranean diet and whole natural foods.  6.  Healthcare screening.  Patient with strong family history of CAD.  She could consider a calcium score CT at her convenience againthat would not be covered by insurance.  She said she did reflect on this she is got a lot going on.  Again we will start to work on diet.   Patient will follow-up as needed in the future.  Dennison Nancy, MD  08/21/2021 9:03 AM    [address], [place], [place]  [id number] Phone: [phone]; Fax: [phone]

## 2021-08-21 NOTE — Patient Instructions (Signed)
Medication Instructions:  Your physician recommends that you continue on your current medications as directed. Please refer to the Current Medication list given to you today.  *If you need a refill on your cardiac medications before your next appointment, please call your pharmacy*   Lab Work: NONE   If you have labs (blood work) drawn today and your tests are completely normal, you will receive your results only by: [place] (if you have MyChart) OR A paper copy in the mail If you have any lab test that is abnormal or we need to change your treatment, we will call you to review the results.   Testing/Procedures: NONE    Follow-Up: At [facility], you and your health needs are our priority.  As part of our continuing mission to provide you with exceptional heart care, we have created designated Provider Care Teams.  These Care Teams include your primary Cardiologist (physician) and Advanced Practice Providers (APPs -  Physician Assistants and Nurse Practitioners) who all work together to provide you with the care you need, when you need it.  We recommend signing up for the patient portal called "MyChart".  Sign up information is provided on this After Visit Summary.  MyChart is used to connect with patients for Virtual Visits (Telemedicine).  Patients are able to view lab/test results, encounter notes, upcoming appointments, etc.  Non-urgent messages can be sent to your provider as well.   To learn more about what you can do with MyChart, go to [email or web address].    Your next appointment:    As Needed   The format for your next appointment:   In Person  Provider:   Dorris Carnes, MD    Other Instructions Thank you for choosing [facility]!

## 2021-08-28 ENCOUNTER — Encounter: Payer: Managed Care, Other (non HMO) | Admitting: Nurse Practitioner

## 2021-09-04 ENCOUNTER — Ambulatory Visit: Payer: Managed Care, Other (non HMO) | Admitting: Nurse Practitioner

## 2021-09-09 IMAGING — MG DIGITAL DIAGNOSTIC BILAT W/ TOMO W/ CAD
6 of 10 series · 6 of 30 positions shown · non-contrast
Comparison: Previous exams.

CLINICAL DATA: [age] year old female with a palpable area of
concern in the right axilla.

EXAM:
DIGITAL DIAGNOSTIC BILATERAL MAMMOGRAM WITH TOMO AND CAD; ULTRASOUND
RIGHT BREAST LIMITED

[R CC synth-2D]
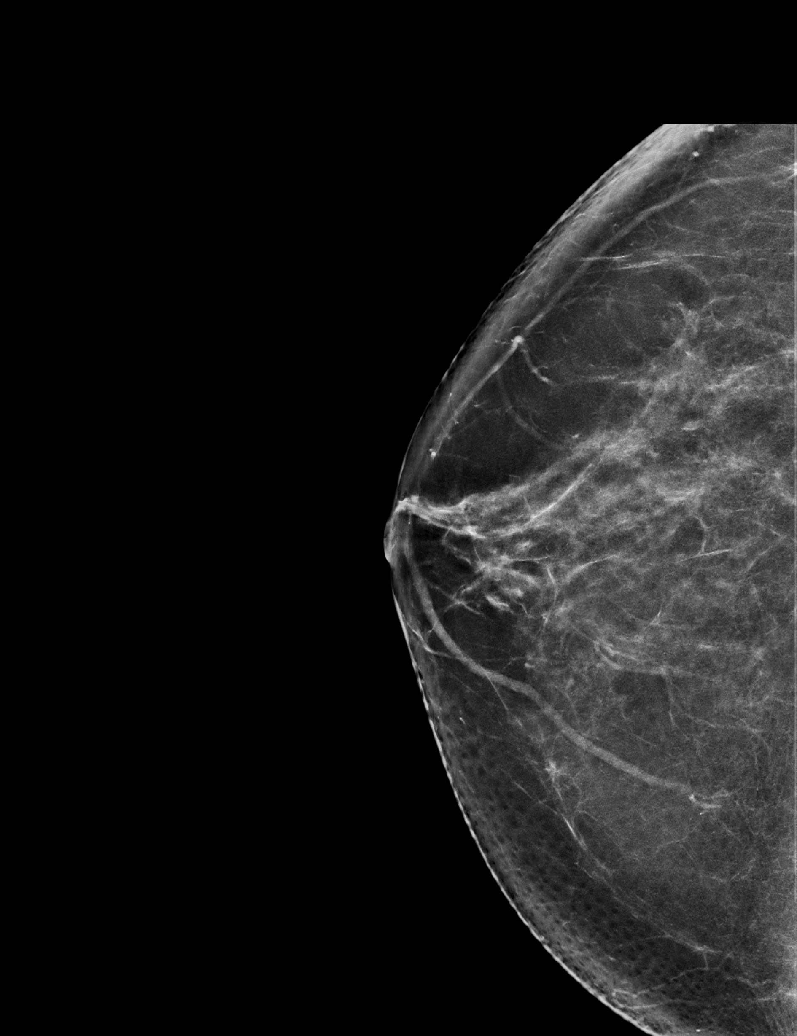

[R TAN synth-2D]
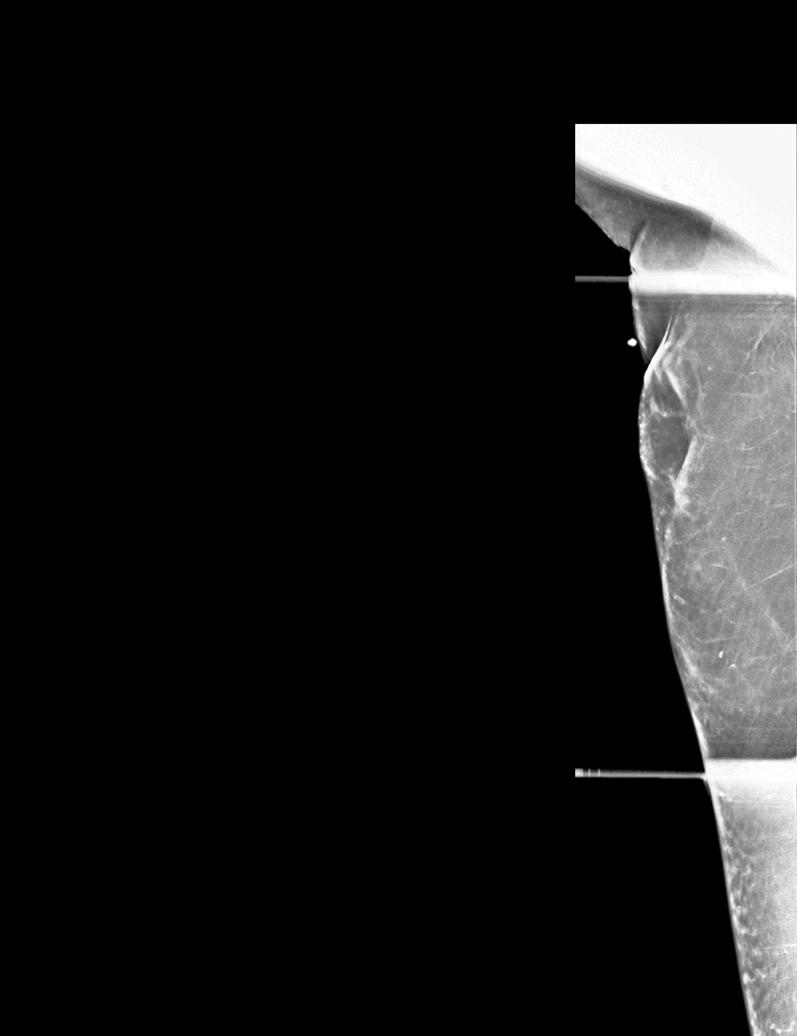

[L MLO synth-2D]
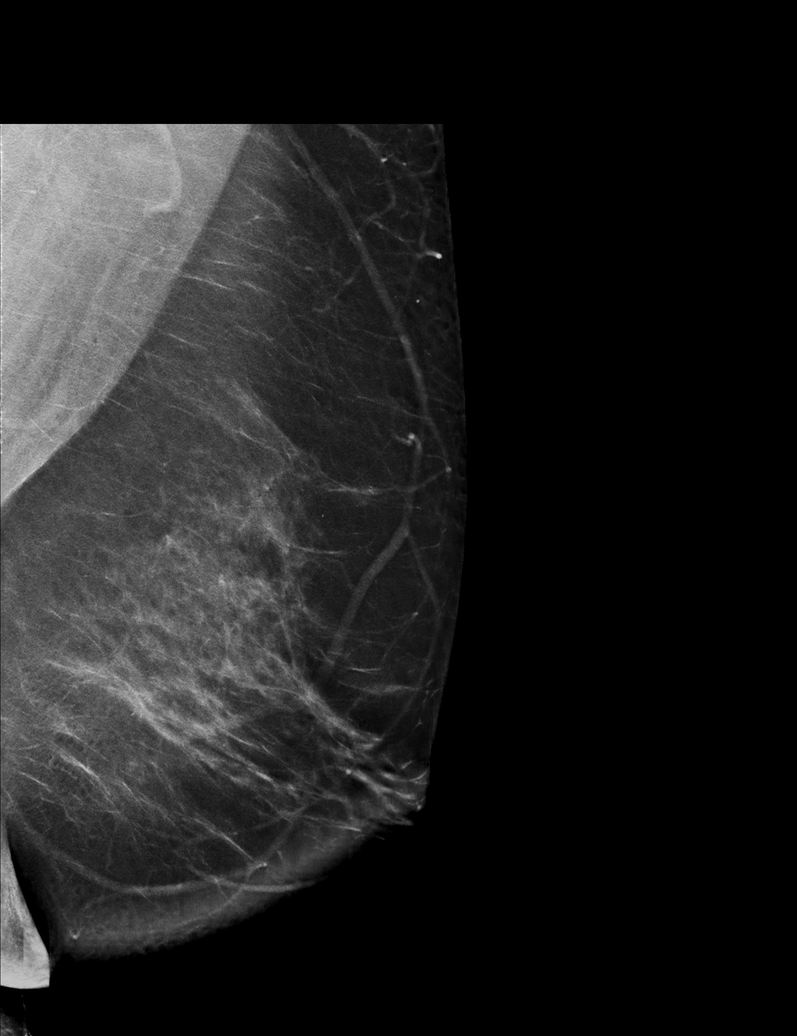

[R MLO synth-2D]
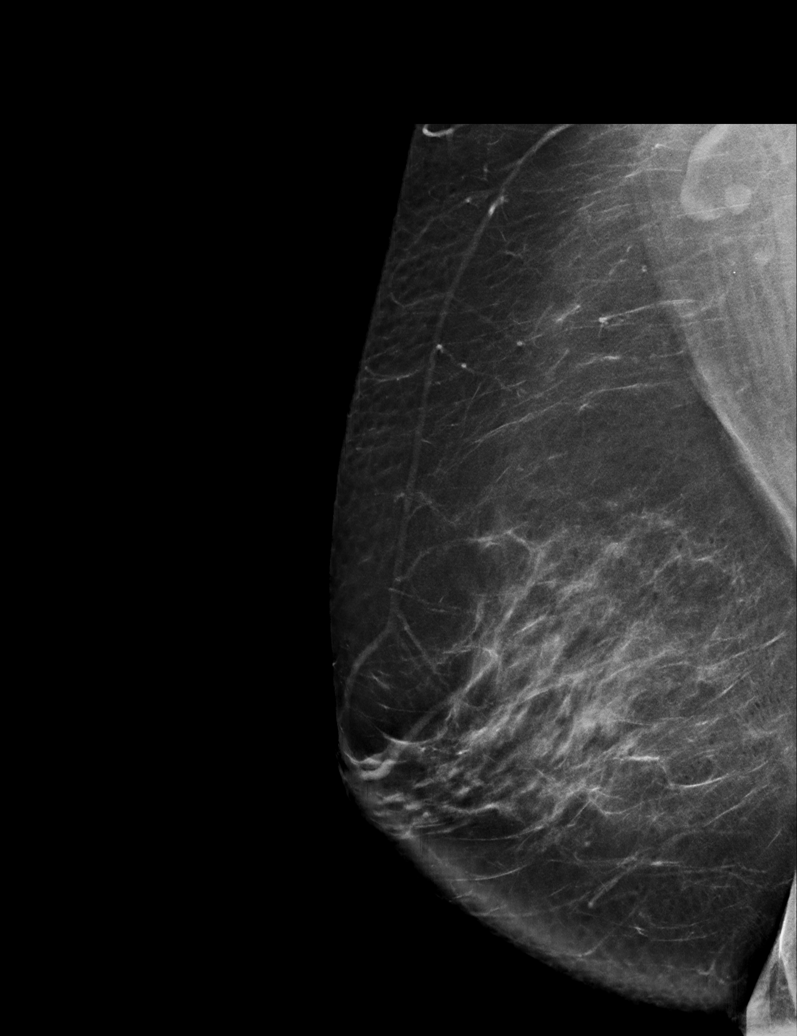

[L CC synth-2D]
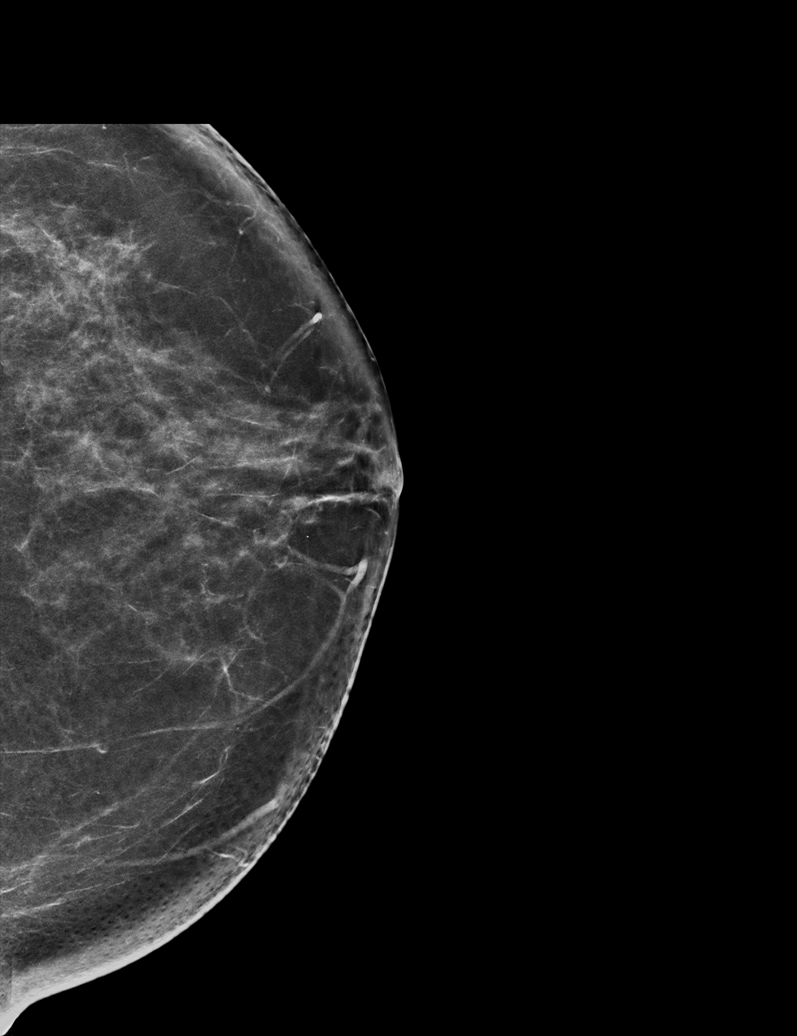

[R TAN tomo · tomo slice 29/57.0]
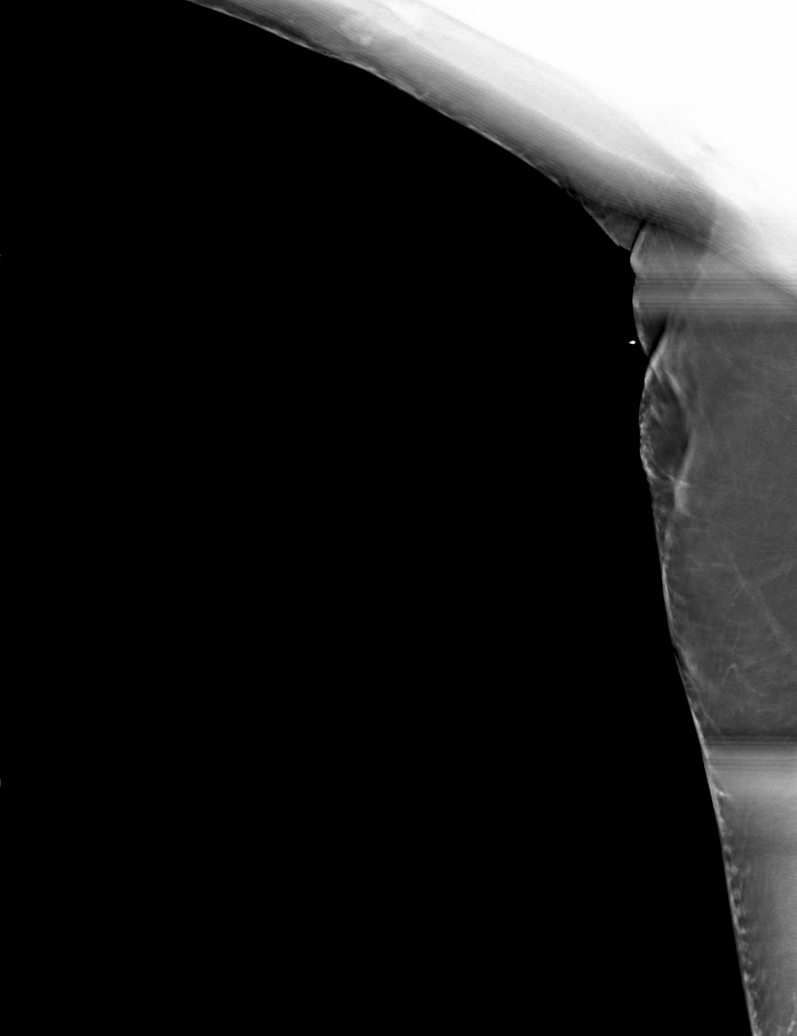

[6 of 30 positions shown; findings below may reference images not displayed]

ACR Breast Density Category b: There are scattered areas of
fibroglandular density.
FINDINGS: No suspicious masses or calcifications are seen in either breast.
Spot compression tangential tomograms were performed over the
palpable area of concern in the right axilla with no definite
abnormality seen.

Mammographic images were processed with CAD.

Physical examination of the right axilla in the region of palpable
concern reveals soft fullness without a discrete underlying palpable
mass.

Targeted ultrasound of the right axilla was performed. No suspicious
masses or abnormalities seen, only normal-appearing fatty tissue
identified.
IMPRESSION: 1. No mammographic or sonographic abnormalities in the region of
palpable concern in the right axilla.

2.  No mammographic evidence of malignancy in either breast.

RECOMMENDATION:
1. Recommend further management of the palpable area of concern in
the right axilla be based on clinical assessment.

2.  Screening mammogram in one year.(Code:QZ-D-V99)

I have discussed the findings and recommendations with the patient.
If applicable, a reminder letter will be sent to the patient
regarding the next appointment.

BI-RADS CATEGORY  1: Negative.

## 2021-09-09 IMAGING — US US BREAST*R* LIMITED INC AXILLA
1 series · 3 of 3 positions shown · non-contrast
Comparison: Previous exams.

CLINICAL DATA: [age] year old female with a palpable area of
concern in the right axilla.

EXAM:
DIGITAL DIAGNOSTIC BILATERAL MAMMOGRAM WITH TOMO AND CAD; ULTRASOUND
RIGHT BREAST LIMITED

[Series 1: us breast*right* limited inc axilla · 0.07mm/px · 3 of 3 slices shown]
[im 1/3]
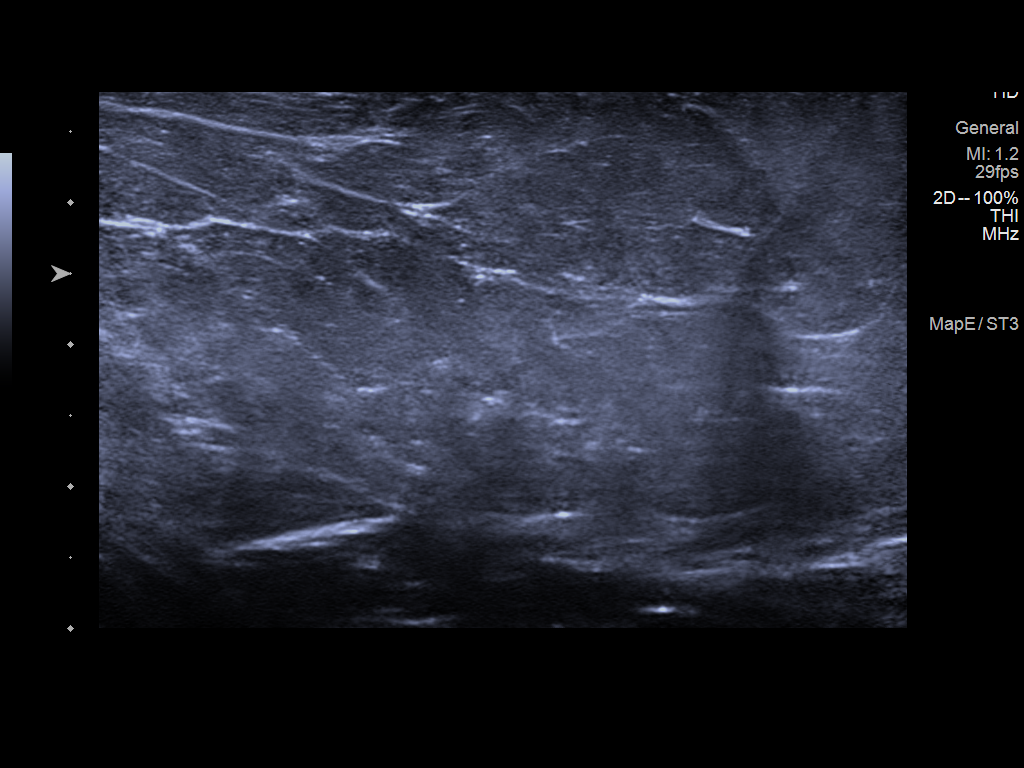
[im 2/3]
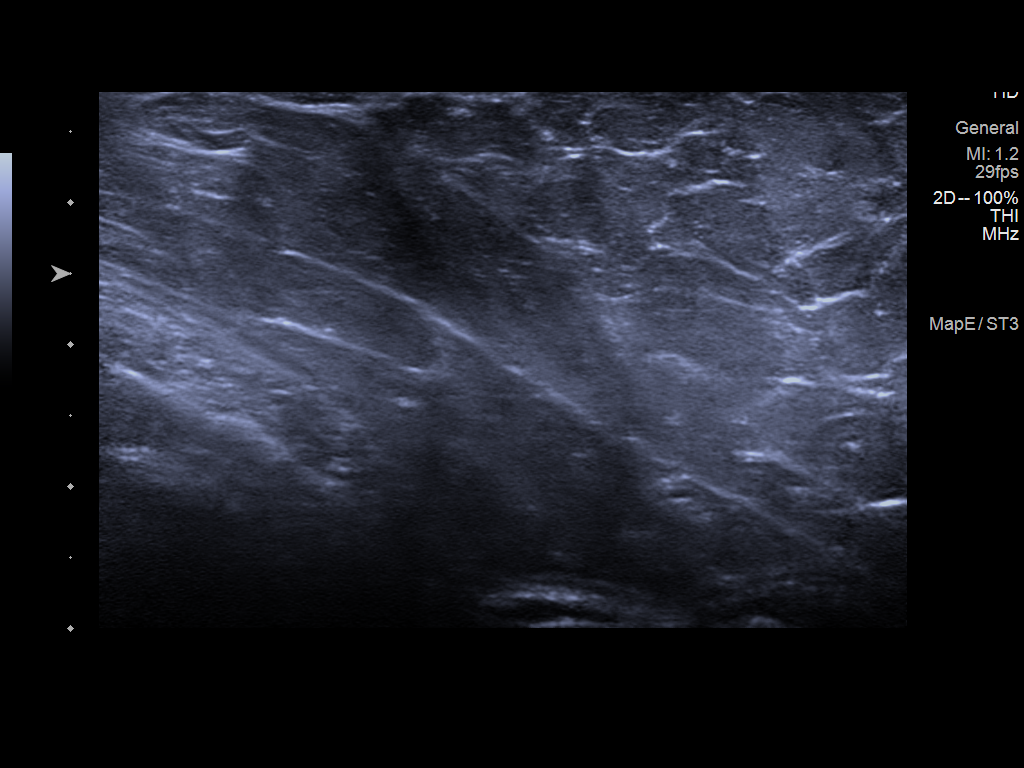
[im 3/3]
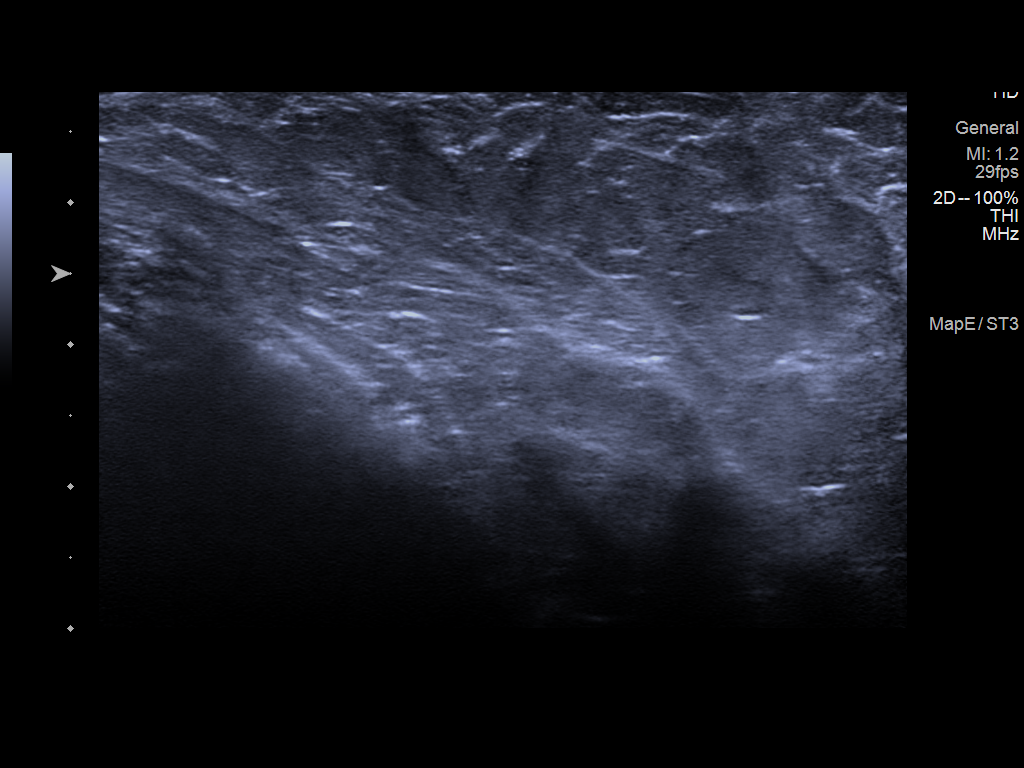

[3 of 3 positions shown; findings below may reference images not displayed]

ACR Breast Density Category b: There are scattered areas of
fibroglandular density.
FINDINGS: No suspicious masses or calcifications are seen in either breast.
Spot compression tangential tomograms were performed over the
palpable area of concern in the right axilla with no definite
abnormality seen.

Mammographic images were processed with CAD.

Physical examination of the right axilla in the region of palpable
concern reveals soft fullness without a discrete underlying palpable
mass.

Targeted ultrasound of the right axilla was performed. No suspicious
masses or abnormalities seen, only normal-appearing fatty tissue
identified.
IMPRESSION: 1. No mammographic or sonographic abnormalities in the region of
palpable concern in the right axilla.

2.  No mammographic evidence of malignancy in either breast.

RECOMMENDATION:
1. Recommend further management of the palpable area of concern in
the right axilla be based on clinical assessment.

2.  Screening mammogram in one year.(Code:QZ-D-V99)

I have discussed the findings and recommendations with the patient.
If applicable, a reminder letter will be sent to the patient
regarding the next appointment.

BI-RADS CATEGORY  1: Negative.

## 2021-09-20 ENCOUNTER — Other Ambulatory Visit: Payer: Self-pay | Admitting: Nurse Practitioner

## 2021-09-20 DIAGNOSIS — E039 Hypothyroidism, unspecified: Secondary | ICD-10-CM

## 2022-02-11 ENCOUNTER — Encounter (HOSPITAL_COMMUNITY): Payer: Self-pay | Admitting: *Deleted

## 2022-02-11 ENCOUNTER — Observation Stay (HOSPITAL_COMMUNITY)
Admission: EM | Admit: 2022-02-11 | Discharge: 2022-02-12 | Disposition: A | Discharge status: Home / Self Care | Payer: 59 | Attending: Emergency Medicine | Admitting: Emergency Medicine

## 2022-02-11 ENCOUNTER — Other Ambulatory Visit: Payer: Self-pay

## 2022-02-11 ENCOUNTER — Emergency Department (HOSPITAL_COMMUNITY): Payer: 59

## 2022-02-11 DIAGNOSIS — R079 Chest pain, unspecified: Secondary | ICD-10-CM | POA: Diagnosis present

## 2022-02-11 DIAGNOSIS — R0789 Other chest pain: Principal | ICD-10-CM | POA: Insufficient documentation

## 2022-02-11 DIAGNOSIS — Z91040 Latex allergy status: Secondary | ICD-10-CM | POA: Diagnosis not present

## 2022-02-11 DIAGNOSIS — I200 Unstable angina: Secondary | ICD-10-CM | POA: Insufficient documentation

## 2022-02-11 DIAGNOSIS — Z96659 Presence of unspecified artificial knee joint: Secondary | ICD-10-CM | POA: Diagnosis not present

## 2022-02-11 DIAGNOSIS — Z79899 Other long term (current) drug therapy: Secondary | ICD-10-CM | POA: Diagnosis not present

## 2022-02-11 DIAGNOSIS — E66812 Obesity, class 2: Secondary | ICD-10-CM | POA: Diagnosis present

## 2022-02-11 DIAGNOSIS — I10 Essential (primary) hypertension: Secondary | ICD-10-CM | POA: Diagnosis not present

## 2022-02-11 DIAGNOSIS — Z87891 Personal history of nicotine dependence: Secondary | ICD-10-CM | POA: Diagnosis not present

## 2022-02-11 DIAGNOSIS — E669 Obesity, unspecified: Secondary | ICD-10-CM | POA: Diagnosis not present

## 2022-02-11 DIAGNOSIS — E079 Disorder of thyroid, unspecified: Secondary | ICD-10-CM | POA: Diagnosis present

## 2022-02-11 LAB — BASIC METABOLIC PANEL
Anion gap: 8 (ref 5–15)
BUN: 15 mg/dL (ref 6–20)
CO2: 23 mmol/L (ref 22–32)
Calcium: 8.8 mg/dL — ABNORMAL LOW (ref 8.9–10.3)
Chloride: 106 mmol/L (ref 98–111)
Creatinine, Ser: 0.74 mg/dL (ref 0.44–1.00)
GFR, Estimated: >60 mL/min (ref >60)
Glucose, Bld: 112 mg/dL — ABNORMAL HIGH (ref 70–99)
Potassium: 3.7 mmol/L (ref 3.5–5.1)
Sodium: 137 mmol/L (ref 135–145)

## 2022-02-11 LAB — TROPONIN I (HIGH SENSITIVITY)
Troponin I (High Sensitivity): <2 ng/L (ref <18)
Troponin I (High Sensitivity): <2 ng/L (ref <18)

## 2022-02-11 LAB — CREATININE, SERUM
Creatinine, Ser: 0.66 mg/dL (ref 0.44–1.00)
GFR, Estimated: >60 mL/min (ref >60)

## 2022-02-11 LAB — CBC
HCT: 44.2 % (ref 36.0–46.0)
Hemoglobin: 15.1 g/dL — ABNORMAL HIGH (ref 12.0–15.0)
MCH: 30.7 pg (ref 26.0–34.0)
MCHC: 34.2 g/dL (ref 30.0–36.0)
MCV: 89.8 fL (ref 80.0–100.0)
Platelets: 335 10*3/uL (ref 150–400)
RBC: 4.92 MIL/uL (ref 3.87–5.11)
RDW: 12.3 % (ref 11.5–15.5)
WBC: 11.1 10*3/uL — ABNORMAL HIGH (ref 4.0–10.5)
nRBC: 0 % (ref 0.0–0.2)

## 2022-02-11 LAB — HIV ANTIBODY (ROUTINE TESTING W REFLEX): HIV Screen 4th Generation wRfx: NONREACTIVE

## 2022-02-11 MED ORDER — ENOXAPARIN SODIUM 40 MG/0.4ML IJ SOSY: 40.0000 mg | PREFILLED_SYRINGE | INTRAMUSCULAR | Status: DC

## 2022-02-11 MED ORDER — NAPROXEN 250 MG PO TABS: 250.0000 mg | ORAL_TABLET | Freq: Every day | ORAL | Status: DC | PRN

## 2022-02-11 MED ORDER — ONDANSETRON HCL 4 MG PO TABS: 4.0000 mg | ORAL_TABLET | Freq: Four times a day (QID) | ORAL | Status: DC | PRN

## 2022-02-11 MED ORDER — NITROGLYCERIN 0.4 MG SL SUBL
0.4000 mg | SUBLINGUAL_TABLET | Freq: Once | SUBLINGUAL | Status: AC
  Administered 2022-02-11: 0.4 mg via SUBLINGUAL
  Filled 2022-02-11: qty 1

## 2022-02-11 MED ORDER — LISINOPRIL 5 MG PO TABS
5.0000 mg | ORAL_TABLET | Freq: Every day | ORAL | Status: DC
  Administered 2022-02-12: 5 mg via ORAL
  Filled 2022-02-11: qty 1

## 2022-02-11 MED ORDER — ACETAMINOPHEN 650 MG RE SUPP: 650.0000 mg | Freq: Four times a day (QID) | RECTAL | Status: DC | PRN

## 2022-02-11 MED ORDER — ONDANSETRON HCL 4 MG/2ML IJ SOLN: 4.0000 mg | Freq: Four times a day (QID) | INTRAMUSCULAR | Status: DC | PRN

## 2022-02-11 MED ORDER — VITAMIN D 25 MCG (1000 UNIT) PO TABS
5000.0000 [IU] | ORAL_TABLET | Freq: Every day | ORAL | Status: DC
  Administered 2022-02-12: 5000 [IU] via ORAL
  Filled 2022-02-11: qty 5

## 2022-02-11 MED ORDER — LEVOTHYROXINE SODIUM 100 MCG PO TABS
100.0000 ug | ORAL_TABLET | Freq: Every day | ORAL | Status: DC
  Administered 2022-02-12: 100 ug via ORAL
  Filled 2022-02-11: qty 1

## 2022-02-11 MED ORDER — ACETAMINOPHEN 325 MG PO TABS: 650.0000 mg | ORAL_TABLET | Freq: Four times a day (QID) | ORAL | Status: DC | PRN

## 2022-02-11 NOTE — ED Provider Notes (Signed)
?[facility] ?Provider Note ? ? ?CSN: [id number] ?Arrival date & time: 02/11/22  1155 ? ?  ? ?History ? ?Chief Complaint  ?Patient presents with  ? Chest Pain  ? ? ?[name] is a 56 y.o. female. ? ? ?Chest Pain ? ?Patient with medical see notable for hypertension, first-degree heart block, POTS, hyperlipidemia, tobacco dependence, hypothyroid presents today due to chest pain.  The pain started yesterday in the evening, feels like a squeezing pressure or like something is sitting on her chest.  It radiates to her jaw and her left arm.  She denies feeling short of breath but did feel nauseated, did not vomit.  States she took an aspirin this morning which helped somewhat, was given nitroglycerin which improved the pain significantly but did not fully resolve it.   ? ?Patient is followed by Dr. Dorris Carnes with cardiology. ? ?Home Medications ?Prior to Admission medications   ?Medication Sig Start Date End Date Taking? Authorizing Provider  ?Cholecalciferol (VITAMIN D3) 125 MCG (5000 UT) CAPS Take 5,000 Units by mouth daily.   Yes [provider]  ?levothyroxine (SYNTHROID) 100 MCG tablet TAKE 1 TABLET BY MOUTH EVERY DAY DISCONTINUE 112MCG ?Patient taking differently: Take 100 mcg by mouth daily before breakfast. 09/20/21  Yes Noreene Larsson, NP  ?lisinopril (ZESTRIL) 5 MG tablet Take 1 tablet (5 mg total) by mouth daily. 06/07/21  Yes Noreene Larsson, NP  ?naproxen sodium (ALEVE) 220 MG tablet Take 220 mg by mouth daily as needed (pain).   Yes [provider]  ?acetaminophen (TYLENOL) 500 MG tablet Take 1,000 mg by mouth every 6 (six) hours as needed for mild pain or moderate pain. ?Patient not taking: Reported on 02/11/2022    [provider]  ?   ? ?Allergies    ?Other, Penicillins, Latex, and Tape   ? ?Review of Systems   ?Review of Systems  ?Cardiovascular:  Positive for chest pain.  ? ?Physical Exam ?Updated Vital Signs ?BP 115/70   Pulse 88   Temp 98.8 ?F (37.1 ?C)  (Oral)   Resp 16   Ht '5\' 5"'$  (1.651 m)   Wt 90.7 kg   LMP 09/26/2014   SpO2 97%   BMI 33.28 kg/m?  ?Physical Exam ?Vitals and nursing note reviewed. Exam conducted with a chaperone present.  ?Constitutional:   ?   Appearance: Normal appearance.  ?HENT:  ?   Head: Normocephalic and atraumatic.  ?Eyes:  ?   General: No scleral icterus.    ?   Right eye: No discharge.     ?   Left eye: No discharge.  ?   Extraocular Movements: Extraocular movements intact.  ?   Pupils: Pupils are equal, round, and reactive to light.  ?Cardiovascular:  ?   Rate and Rhythm: Regular rhythm. Tachycardia present.  ?   Pulses: Normal pulses.  ?   Heart sounds: Normal heart sounds. No murmur heard. ?  No friction rub. No gallop.  ?Pulmonary:  ?   Effort: Pulmonary effort is normal. No respiratory distress.  ?   Breath sounds: Normal breath sounds.  ?Abdominal:  ?   General: Abdomen is flat. Bowel sounds are normal. There is no distension.  ?   Palpations: Abdomen is soft.  ?   Tenderness: There is no abdominal tenderness.  ?Skin: ?   General: Skin is warm and dry.  ?   Coloration: Skin is not jaundiced.  ?Neurological:  ?   Mental Status: She is  alert. Mental status is at baseline.  ?   Coordination: Coordination normal.  ? ? ?ED Results / Procedures / Treatments   ?Labs ?(all labs ordered are listed, but only abnormal results are displayed) ?Labs Reviewed  ?BASIC METABOLIC PANEL - Abnormal; Notable for the following components:  ?    Result Value  ? Glucose, Bld 112 (*)   ? Calcium 8.8 (*)   ? All other components within normal limits  ?CBC - Abnormal; Notable for the following components:  ? WBC 11.1 (*)   ? Hemoglobin 15.1 (*)   ? All other components within normal limits  ?TROPONIN I (HIGH SENSITIVITY)  ?TROPONIN I (HIGH SENSITIVITY)  ? ? ?EKG ?None ? ?Radiology ?DG Chest 2 View ? ?Result Date: 02/11/2022 ?CLINICAL DATA:  Acute chest pain and shortness of breath EXAM: CHEST - 2 VIEW COMPARISON:  07/21/2021 FINDINGS: The  cardiomediastinal silhouette is unremarkable. There is no evidence of focal airspace disease, pulmonary edema, suspicious pulmonary nodule/mass, pleural effusion, or pneumothorax. No acute bony abnormalities are identified. IMPRESSION: No active cardiopulmonary disease. Electronically Signed   By: Margarette Canada M.D.   On: 02/11/2022 12:45   ? ?Procedures ?Procedures  ? ? ?Medications Ordered in ED ?Medications  ?nitroGLYCERIN (NITROSTAT) SL tablet 0.4 mg (0.4 mg Sublingual Given 02/11/22 1314)  ? ? ?ED Course/ Medical Decision Making/ A&P ?  ?                        ?Medical Decision Making ?Amount and/or Complexity of Data Reviewed ?Labs: ordered. ?Radiology: ordered. ? ?Risk ?Prescription drug management. ?Decision regarding hospitalization. ? ? ?This patient presents to the ED for concern of chest pain, this involves an extensive number of treatment options, and is a complaint that carries with it a high risk of complications and morbidity.  The differential diagnosis includes ACS, pericarditis, pneumonia, pneumothorax, PE, dissection, esophageal rupture, GERD ? ?Patient's physical exam is not particularly revealing.  She has been complete sentences, not hypoxic.  Radial pulse equal bilaterally 2+, no unilateral leg swelling skin trace edema bilaterally. ? ?Patient has aspirin prior to arrival.  Given improvement of chest pain with nitroglycerin I am concerned about ACS. ? ? ?Additional history obtained:  ? ?Independent historian: husband. ? ?Reviewed external records including cardiology appointments.  Patient was seen in November 2022 by Dr. Dorris Carnes with cardiology.  Patient's last echocardiogram was in 2017, LVEF preserved at that time.  Do not see any more recent cardiovascular imaging. ? ?  ?Lab Tests: ? ?I ordered, viewed, and personally interpreted labs.  The pertinent results include: Slight leukocytosis at 11.1.  No anemia.  No gross electrolyte derangement or AKI, delta troponin negative at less than  2. ? ?  ?Imaging Studies ordered: ? ?I directly visualized the chest x-ray, which showed normal cardiac silhouette ? ?I agree with the radiologist interpretation ?  ? ?ECG/Cardiac monitoring:  ? ?Per my interpretation, EKG shows Sinus tachycardia with a heart rate of 103. ? ?The patient was maintained on a cardiac monitor.  Visualized monitor strip which showed NSR, HR 89 per my interpretation.  ? ? ?Medicines ordered and prescription drug management: ? ?I ordered medication including: nitroglycerin   ? ?I have reviewed the patients home medicines and have made adjustments as needed ? ?  ?Consultations Obtained: ? ?I requested consultation with the cardiology and hospitalist.  Discussed lab and imaging findings as well as pertinent plan - they recommend:  ? ?Spoke Dr.  Turner with cardiology.  They advise admission to hospitalist service here at [facility] with cardiology consult tomorrow.  Recommend serial troponins.  Appreciate their consultation. ? ? ? ?Reevaluation: ? ?After the interventions noted above, I reevaluated the patient and found patient's pain improved significantly nitroglycerin.  Did not fully resolve. ? ? ?Problems addressed / ED Course: ?Chest pain-concern for ACS given significant improvement of chest pain with nitroglycerin.  Patient's heart score is 5.  Patient would benefit from admission and further evaluation, will consult hospitalist. ?  ?Social Determinants of Health: ? ?  ?Disposition: ? ? ?After consideration of the diagnostic results and the patients response to treatment, I feel that the patent would benefit from admission. ? ? ? ? ? ? ? ? ? ?Final Clinical Impression(s) / ED Diagnoses ?Final diagnoses:  ?Unstable angina ([place])  ? ? ?Rx / DC Orders ?ED Discharge Orders   ? ? None  ? ?  ? ? ?  ?Sherrill Raring, PA-C ?02/11/22 1552 ? ?  ?Noemi Chapel, MD ?02/11/22 2316 ? ?

## 2022-02-11 NOTE — Assessment & Plan Note (Addendum)
Patient with very small q waves in the inferior and lateral leads not new from 2022. ?No frank typical angina symptoms. ?High sensitive troponin negative. ? ?Considering patient's risk factors of obesity and hypertension she will benefit from coronary evaluation possible cardiac CT.  ? ?Recycle troponin negative ?Status post evaluation by cardiology, recommending outpatient follow-up, CT coronary to be ordered and follow-up as an outpatient ? ?Continue blood pressure control with lisinopril, increased from 5 to 10 mg daily ? - Lipid profile LDL 86, HDL 48 --- no need for initiation of for statins at this point ?

## 2022-02-11 NOTE — Assessment & Plan Note (Addendum)
Mildly elevated blood pressure, home medication of lisinopril increased from 5 to 10 mg  daily ? ?

## 2022-02-11 NOTE — ED Notes (Signed)
Patient transported to X-ray 

## 2022-02-11 NOTE — ED Notes (Signed)
Patient ambulatory to restroom without assistance. 

## 2022-02-11 NOTE — ED Triage Notes (Signed)
Pt with mid squeezing to chest since last night. + nausea. Dull pain to left arm ?

## 2022-02-11 NOTE — Assessment & Plan Note (Addendum)
Body mass index is 35.33 kg/m?. ? ?Healthy diet and exercise recommended ? ?

## 2022-02-11 NOTE — Assessment & Plan Note (Signed)
Continue with levothyroxine  

## 2022-02-11 NOTE — ED Notes (Signed)
Pt reports relief from chest pressure following SL nitro x1. She did not want to take any further tablets at this time d/t no pain or pressure.  ?

## 2022-02-11 NOTE — H&P (Signed)
?History and Physical  ? ? ?Patient: [name] ZHG:[id number] DOB: 1965/12/12 ?DOA: 02/11/2022 ?DOS: the patient was seen and examined on 02/11/2022 ?PCP: Noreene Larsson, NP  ?Patient coming from: Home ? ?Chief Complaint:  ?Chief Complaint  ?Patient presents with  ? Chest Pain  ? ?HPI: [name] is a 56 y.o. female with medical history significant of obesity class 2, dyslipidemia and hypertension who presented with chest pressure. Reports 2 to 3 days of chest pressure, not exertion related, intermittent with no improving or worsening factors. Last night when she went to bed she had chest pressure that persisted this am when she woke up, prompting her to come to the hospital. Positive mild nausea but not vomiting.  ?She is sedentary but not reported angina.  ?Positive left shoulder and jaw pain not related to chest pressure.  ?Her blood pressure has been well controlled with medical therapy, and she has not had a stress test in the past.  ?  ?Review of Systems: As mentioned in the history of present illness. All other systems reviewed and are negative. ?Past Medical History:  ?Diagnosis Date  ? Allergy   ? multiple  ? Arthritis   ? back and shoulders  ? Chest pain   ? Educated about COVID-19 virus infection 05/17/2020  ? History of first degree AV block 06/22/2020  ? History of vitamin D deficiency 06/22/2020  ? HTN (hypertension) 06/07/2021  ? Hyperlipidemia   ? Hyperthyroidism   ? HYPOTENSION, ORTHOSTATIC 10/29/2008  ? POTS   ? Pedal edema 12/03/2016  ? Peripheral edema   ? Post-menopausal 12/03/2016  ? POTS (postural orthostatic tachycardia syndrome)   ? Thyroid disease   ? Tobacco abuse 12/03/2016  ? Wears glasses   ? ?Past Surgical History:  ?Procedure Laterality Date  ? ARTHROSCOPIC REPAIR ACL  1986  ? right knee  ? KNEE ARTHROPLASTY    ? MASS EXCISION Right 09/30/2014  ? 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 THYROID SURGERY    ? THYROIDECTOMY  2006  ? TILT TABLE STUDY  2007  ? ?Social History:  reports that she quit smoking about 10 months ago. Her smoking use included cigarettes. She started smoking about 31 years ago. She has a 6.50 pack-year smoking history. She has never used smokeless tobacco. She reports that she does not drink alcohol and does not use drugs. ? ?Allergies  ?Allergen Reactions  ? Other Other (See Comments)  ?  Ink Malena Edman) ?Rubber ("big blisters that bust") ?Leather [facility])  ? Penicillins Other (See Comments) and Hives  ?  REACTION: unknown/childhood  ? Latex Rash  ?  REACTION: Also allergic to all rubbers, leathers with moisture and direct contact   ? Tape Rash  ?  EKG stickers cause rash  ? ? ?Family History  ?Problem Relation Age of Onset  ? Heart disease Mother   ?     stents 16  ? Asthma Mother   ? Hearing loss Mother   ? Hypertension Mother   ? Heart disease Father   ? Alzheimer's disease Father   ?     died at 20  ? Alcohol abuse Father   ? Cancer Father   ?     leukemia  ? Cancer Paternal Aunt   ? ? ?Prior to Admission medications   ?Medication Sig Start Date End Date Taking? Authorizing Provider  ?  Cholecalciferol (VITAMIN D3) 125 MCG (5000 UT) CAPS Take 5,000 Units by mouth daily.   Yes [provider]  ?levothyroxine (SYNTHROID) 100 MCG tablet TAKE 1 TABLET BY MOUTH EVERY DAY DISCONTINUE 112MCG ?Patient taking differently: Take 100 mcg by mouth daily before breakfast. 09/20/21  Yes Noreene Larsson, NP  ?lisinopril (ZESTRIL) 5 MG tablet Take 1 tablet (5 mg total) by mouth daily. 06/07/21  Yes Noreene Larsson, NP  ?naproxen sodium (ALEVE) 220 MG tablet Take 220 mg by mouth daily as needed (pain).   Yes [provider]  ?acetaminophen (TYLENOL) 500 MG tablet Take 1,000 mg by mouth every 6 (six) hours as needed for mild pain or moderate pain. ?Patient not taking: Reported on 02/11/2022    [provider]  ? ? ?Physical  Exam: ?Vitals:  ? 02/11/22 1500 02/11/22 1530 02/11/22 1600 02/11/22 1648  ?BP: 130/79 115/70 128/68 131/86  ?Pulse: 89 88 100 92  ?Resp: '16 16 15 16  '$ ?Temp:    98.9 ?F (37.2 ?C)  ?TempSrc:    Oral  ?SpO2: 98% 97% 99% 99%  ?Weight:    96.3 kg  ?Height:    '5\' 5"'$  (1.651 m)  ? ?Neurology awake and alert ?ENT with no pallor or icterus ?Cardiovascular with S1 and S2 present and rhythmic with no gallops, rubs or murmurs.  ?Respiratory with no wheezing, rales, or rhonchi ?Abdomen not distended  ?No lower extremity edema ?Skin no rashes  ?Pain to palpation on left deltoid muscle.  ?Data Reviewed: ? ? ?56 yo female with obesity and hypertension who presented with non exertional chest pressure for the last 2 to 3 days, no history of angina. No recent stress test. On her physical examination her blood pressure is not elevated, clinically she has no signs of heart failure, no S3 or S4 gallops, no rubs or murmurs.  ? ?Na 137, K 3,7, Cl 106, bicarbonate 23, glucose 112, bun 15 cr 0,74 ?High sensitive troponin 2 and 2  ?Wbc 11. Hgb 15,1 plt 335  ? ?Chest radiograph with no infiltrates.  ? ?EKG 108 bpm, normal axis, normal intervals, sinus rhythm, with small q wave lead II, III, Avf, V4 to V6 with no significant ST segment or T wave changes.  ? ?[name] will be admitted to the hospital with the working diagnosis of non chest pressure to rule out coronary artery disease.  ? ?Assessment and Plan: ?* Chest pain ?Patient with very small q waves in the inferior and lateral leads not new from 2022. ?No frank typical angina symptoms. ?High sensitive troponin negative. ? ?Considering patient's risk factors of obesity and hypertension she will benefit from coronary evaluation possible cardiac CT.  ? ?Continue blood pressure control with lisinopril and check lipid profile.  ? ?Thyroid disease ?Continue with levothyroxine  ? ?Essential hypertension ?Continue blood pressure control with lisinopril.  ? ?Class 2 obesity ?Calculated BMI is  35,3 ? ? ? ? ? ? Advance Care Planning:   Code Status: Full Code full  ? ?Consults: cardiology  ? ?Family Communication: I spoke with patient's husbans at the bedside, we talked in detail about patient's condition, plan of care and prognosis and all questions were addressed. ? ? ?Severity of Illness: ?The appropriate patient status for this patient is OBSERVATION. Observation status is judged to be reasonable and necessary in order to provide the required intensity of service to ensure the patient's safety. The patient's presenting symptoms, physical exam findings, and initial radiographic and laboratory data in the  context of their medical condition is felt to place them at decreased risk for further clinical deterioration. Furthermore, it is anticipated that the patient will be medically stable for discharge from the hospital within 2 midnights of admission.  ? ?Author: ?Tawni Millers, MD ?02/11/2022 5:28 PM ? ?For on call review [email or web address].  ?

## 2022-02-12 ENCOUNTER — Other Ambulatory Visit: Payer: Self-pay | Admitting: *Deleted

## 2022-02-12 DIAGNOSIS — E669 Obesity, unspecified: Secondary | ICD-10-CM | POA: Diagnosis not present

## 2022-02-12 DIAGNOSIS — E079 Disorder of thyroid, unspecified: Secondary | ICD-10-CM | POA: Diagnosis not present

## 2022-02-12 DIAGNOSIS — I10 Essential (primary) hypertension: Secondary | ICD-10-CM | POA: Diagnosis not present

## 2022-02-12 DIAGNOSIS — R072 Precordial pain: Secondary | ICD-10-CM

## 2022-02-12 DIAGNOSIS — R079 Chest pain, unspecified: Secondary | ICD-10-CM | POA: Diagnosis not present

## 2022-02-12 LAB — BASIC METABOLIC PANEL
Anion gap: 4 — ABNORMAL LOW (ref 5–15)
BUN: 15 mg/dL (ref 6–20)
CO2: 29 mmol/L (ref 22–32)
Calcium: 8.7 mg/dL — ABNORMAL LOW (ref 8.9–10.3)
Chloride: 106 mmol/L (ref 98–111)
Creatinine, Ser: 0.72 mg/dL (ref 0.44–1.00)
GFR, Estimated: >60 mL/min (ref >60)
Glucose, Bld: 107 mg/dL — ABNORMAL HIGH (ref 70–99)
Potassium: 4.2 mmol/L (ref 3.5–5.1)
Sodium: 139 mmol/L (ref 135–145)

## 2022-02-12 LAB — LIPID PANEL
Cholesterol: 139 mg/dL (ref 0–200)
HDL: 48 mg/dL (ref >40)
LDL Cholesterol: 86 mg/dL (ref 0–99)
Total CHOL/HDL Ratio: 2.9 RATIO
Triglycerides: 24 mg/dL (ref <150)
VLDL: 5 mg/dL (ref 0–40)

## 2022-02-12 LAB — CBC
HCT: 42 % (ref 36.0–46.0)
Hemoglobin: 13.7 g/dL (ref 12.0–15.0)
MCH: 29.9 pg (ref 26.0–34.0)
MCHC: 32.6 g/dL (ref 30.0–36.0)
MCV: 91.7 fL (ref 80.0–100.0)
Platelets: 281 10*3/uL (ref 150–400)
RBC: 4.58 MIL/uL (ref 3.87–5.11)
RDW: 12.4 % (ref 11.5–15.5)
WBC: 8.2 10*3/uL (ref 4.0–10.5)
nRBC: 0 % (ref 0.0–0.2)

## 2022-02-12 MED ORDER — LISINOPRIL 5 MG PO TABS: 10.0000 mg | ORAL_TABLET | Freq: Every day | ORAL | 3 refills | Status: DC

## 2022-02-12 MED ORDER — HYDROCORTISONE 1 % EX CREA
TOPICAL_CREAM | Freq: Three times a day (TID) | CUTANEOUS | Status: DC
  Filled 2022-02-12: qty 28.4

## 2022-02-12 MED ORDER — ASPIRIN EC 81 MG PO TBEC: 81.0000 mg | DELAYED_RELEASE_TABLET | Freq: Every day | ORAL | 3 refills | Status: DC

## 2022-02-12 NOTE — Progress Notes (Signed)
Orders placed for Coronary CTA, and CT cardiac scoring  ? ?

## 2022-02-12 NOTE — Progress Notes (Signed)
Pt has discharge orders, d/c teaching given and IV removed, pt has no further questions at this time nor does husband who is bedside. Pt requests to walk out to vehicle with husband along side.  ?

## 2022-02-12 NOTE — Discharge Summary (Signed)
?Physician Discharge Summary ?  ?Patient: [name] MRN: [id number] DOB: [birth year]  ?Admit date:     02/11/2022  ?Discharge date: 02/12/22  ?Discharge Physician: Valeria Batman Shadi Larner  ? ?PCP: Noreene Larsson, NP  ? ?Recommendations at discharge:  ?Follow-up with cardiologist Dr. Harl Bowie and his team as an outpatient, currently recommending CT coronary, calcium scoring  ?We are recommending to follow with a PCP in 2 weeks ?Recommending abstaining from excessive alcohol, discontinuing tobacco use, healthy diet and exercise ? ?Discharge Diagnoses: ?Principal Problem: ?  Chest pain ?Active Problems: ?  Thyroid disease ?  Essential hypertension ?  Class 2 obesity ? ?Resolved Problems: ?  * No resolved hospital problems. * ? ?Hospital Course: ?No notes on file ? ?Assessment and Plan: ?* Chest pain ?Patient with very small q waves in the inferior and lateral leads not new from 2022. ?No frank typical angina symptoms. ?High sensitive troponin negative. ? ?Considering patient's risk factors of obesity and hypertension she will benefit from coronary evaluation possible cardiac CT.  ? ?Recycle troponin negative ?Status post evaluation by cardiology, recommending outpatient follow-up, CT coronary to be ordered and follow-up as an outpatient ? ?Continue blood pressure control with lisinopril, increased from 5 to 10 mg daily ? - Lipid profile LDL 86, HDL 48 --- no need for initiation of for statins at this point ? ?Thyroid disease ?Continue with levothyroxine  ? ?Essential hypertension ?Mildly elevated blood pressure, home medication of lisinopril increased from 5 to 10 mg  daily ? ? ?Class 2 obesity ?Body mass index is 35.33 kg/m?. ? ?Healthy diet and exercise recommended ? ? ? ?Consultants: Cardiologist ?Procedures performed: None ?Disposition: Home ?Diet recommendation:  ?Discharge Diet Orders (From admission, onward)  ? ?  Start     Ordered  ? 02/12/22 0000  Diet - low sodium heart healthy       ? 02/12/22 1115  ? ?  ?  ? ?   ? ?Cardiac diet ?DISCHARGE MEDICATION: ?Allergies as of 02/12/2022   ? ?   Reactions  ? Other Other (See Comments)  ? Ink Malena Edman) ?Rubber ("big blisters that bust") ?Leather [facility])  ? Penicillins Other (See Comments), Hives  ? REACTION: unknown/childhood  ? Latex Rash  ? REACTION: Also allergic to all rubbers, leathers with moisture and direct contact   ? Tape Rash  ? EKG stickers cause rash  ? ?  ? ?  ?Medication List  ?  ? ?STOP taking these medications   ? ?acetaminophen 500 MG tablet ?Commonly known as: TYLENOL ?  ? ?  ? ?TAKE these medications   ? ?aspirin EC 81 MG tablet ?Take 1 tablet (81 mg total) by mouth daily. Swallow whole. ?  ?levothyroxine 100 MCG tablet ?Commonly known as: SYNTHROID ?TAKE 1 TABLET BY MOUTH EVERY DAY DISCONTINUE 112MCG ?What changed: See the new instructions. ?  ?lisinopril 5 MG tablet ?Commonly known as: ZESTRIL ?Take 2 tablets (10 mg total) by mouth daily. ?What changed: how much to take ?  ?naproxen sodium 220 MG tablet ?Commonly known as: ALEVE ?Take 220 mg by mouth daily as needed (pain). ?  ?Vitamin D3 125 MCG (5000 UT) Caps ?Take 5,000 Units by mouth daily. ?  ? ?  ? ? ?Discharge Exam: ?Filed Weights  ? 02/11/22 1202 02/11/22 1648  ?Weight: 90.7 kg 96.3 kg  ? ? ? ? ?Physical Exam: ?  ?General:  AAO x 3,  cooperative, no distress;   ?HEENT:  Normocephalic, PERRL, otherwise with in Normal limits   ?  Neuro:  CNII-XII intact. , normal motor and sensation, reflexes intact   ?Lungs:   Clear to auscultation BL, Respirations unlabored,  ?No wheezes / crackles  ?Cardio:    S1/S2, RRR, No murmure, No Rubs or Gallops   ?Abdomen:  Soft, non-tender, bowel sounds active all four quadrants, ?no guarding or peritoneal signs.  ?Muscular  ?skeletal:  Limited exam -global generalized weaknesses ?- in bed, able to move all 4 extremities,   ?2+ pulses,  symmetric, No pitting edema  ?Skin:  Dry, warm to touch, negative for any Rashes,  ?Wounds: Please see nursing documentation ?   ? ? ?   ? ? ?Condition at discharge: good ? ?The results of significant diagnostics from this hospitalization (including imaging, microbiology, ancillary and laboratory) are listed below for reference.  ? ?Imaging Studies: ?DG Chest 2 View ? ?Result Date: 02/11/2022 ?CLINICAL DATA:  Acute chest pain and shortness of breath EXAM: CHEST - 2 VIEW COMPARISON:  07/21/2021 FINDINGS: The cardiomediastinal silhouette is unremarkable. There is no evidence of focal airspace disease, pulmonary edema, suspicious pulmonary nodule/mass, pleural effusion, or pneumothorax. No acute bony abnormalities are identified. IMPRESSION: No active cardiopulmonary disease. Electronically Signed   By: Margarette Canada M.D.   On: 02/11/2022 12:45   ? ?Microbiology: ?No results found for this or any previous visit. ? ?Labs: ?CBC: ?Recent Labs  ?Lab 02/11/22 ?1205 02/12/22 ?0501  ?WBC 11.1* 8.2  ?HGB 15.1* 13.7  ?HCT 44.2 42.0  ?MCV 89.8 91.7  ?PLT 335 281  ? ?Basic Metabolic Panel: ?Recent Labs  ?Lab 02/11/22 ?1205 02/11/22 ?1719 02/12/22 ?0501  ?NA 137  --  139  ?K 3.7  --  4.2  ?CL 106  --  106  ?CO2 23  --  29  ?GLUCOSE 112*  --  107*  ?BUN 15  --  15  ?CREATININE 0.74 0.66 0.72  ?CALCIUM 8.8*  --  8.7*  ? ?Liver Function Tests: ?No results for input(s): AST, ALT, ALKPHOS, BILITOT, PROT, ALBUMIN in the last 168 hours. ?CBG: ?No results for input(s): GLUCAP in the last 168 hours. ? ?Discharge time spent: greater than 30 minutes. ? ?Signed: ?Deatra James, MD ?[facility] ?02/12/2022 ?

## 2022-02-12 NOTE — Consult Note (Addendum)
Cardiology Consultation:   Patient ID: [name] MRN: [id number]; DOB: 19-Jan-1966  Admit date: 02/11/2022 Date of Consult: 02/12/2022  PCP:  Heather Roberts, NP   [facility] Providers Cardiologist:  None        Patient Profile:   [name] is a 56 y.o. female with a hx of POTS who is being seen 02/12/2022 for the evaluation of chest pain at the request of Dr. Flossie Dibble.  History of Present Illness:   [name] is a 56 yo family with history of POTS, HTN, HLD, family history of early CAD, obesity, hypothyroidism.   Patient last saw Dr. Tenny Craw 08/2021 had quit smoking and no symptoms. Calcium score recommended because of family history of CAD but not done.   Patient ate a steak/potato dinner Sat night and when she went to bed 11pm felt chest tightness/pressure. Eased about 2 am when she fell asleep. Got up Sunday and went to church-still some tightness and felt a soreness in left arm. BP was up 180's. No pain since she's here. Smoking again when around friends/family that smoke~5-6 cigarettes in a weekend. MGM CABG in 50's, mother stents 60's. No chest pain since here but relieved with NTG in ED. Walks 30 min daily without symptoms but has had mild chest tightness-fleeting for about 2 weeks. No recent trouble with POTS.   Past Medical History:  Diagnosis Date   Allergy    multiple   Arthritis    back and shoulders   Chest pain    Educated about COVID-19 virus infection 05/17/2020   History of first degree AV block 06/22/2020   History of vitamin D deficiency 06/22/2020   HTN (hypertension) 06/07/2021   Hyperlipidemia    Hyperthyroidism    HYPOTENSION, ORTHOSTATIC 10/29/2008   POTS    Pedal edema 12/03/2016   Peripheral edema    Post-menopausal 12/03/2016   POTS (postural orthostatic tachycardia syndrome)    Thyroid disease    Tobacco abuse 12/03/2016   Wears glasses     Past Surgical History:  Procedure Laterality Date   ARTHROSCOPIC REPAIR ACL  1986   right knee   KNEE  ARTHROPLASTY     MASS EXCISION Right 09/30/2014   Procedure: 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 Authorizing Provider  Cholecalciferol (VITAMIN D3) 125 MCG (5000 UT) CAPS Take 5,000 Units by mouth daily.   Yes [provider]  levothyroxine (SYNTHROID) 100 MCG tablet TAKE 1 TABLET BY MOUTH EVERY DAY DISCONTINUE Patient taking differently: Take 100 mcg by mouth daily before breakfast. 09/20/21  Yes Heather Roberts, NP  lisinopril (ZESTRIL) 5 MG tablet Take 1 tablet (5 mg total) by mouth daily. 06/07/21  Yes Heather Roberts, NP  naproxen sodium (ALEVE) 220 MG tablet Take 220 mg by mouth daily as needed (pain).   Yes [provider]  acetaminophen (TYLENOL) 500 MG tablet Take 1,000 mg by mouth every 6 (six) hours as needed for mild pain or moderate pain. Patient not taking: Reported on 02/11/2022    [provider]    Inpatient Medications: Scheduled Meds:  cholecalciferol  5,000 Units Oral Daily   enoxaparin (LOVENOX) injection  40 mg  Subcutaneous Q24H   hydrocortisone cream   Topical TID   levothyroxine  100 mcg Oral Q0600   lisinopril  5 mg Oral Daily   Continuous Infusions:  PRN Meds: acetaminophen **OR** acetaminophen, naproxen, ondansetron **OR** ondansetron (ZOFRAN) IV  Allergies:    Allergies  Allergen Reactions   Other Other (See Comments)    Ink (Hives,Rash) Rubber ("big blisters that bust") Leather (Hives,Rash)   Penicillins Other (See Comments) and Hives    REACTION: unknown/childhood   Latex Rash    REACTION: Also allergic to all rubbers, leathers with moisture and direct contact    Tape Rash    EKG stickers cause rash    Social  History:   Social History   Socioeconomic History   Marital status: Married    Spouse name: Not on file   Number of children: 0   Years of education: 13   Highest education level: Tax adviser degree: occupational, Scientist, product/process development, or vocational program  Occupational History   Occupation: parts specialist     Comment: [employer]   Tobacco Use   Smoking status: Former    Packs/day: 0.25    Years: 26.00    Pack years: 6.50    Types: Cigarettes    Start date: 10/15/1990    Quit date: 03/26/2021    Years since quitting: 0.8   Smokeless tobacco: Never  Vaping Use   Vaping Use: Never used  Substance and Sexual Activity   Alcohol use: No    Comment: very rare   Drug use: No   Sexual activity: Never  Other Topics Concern   Not on file  Social History Narrative   Divorced   Lives ex husband-relationship is good friends   Chemical engineer   [employer]      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   Social Determinants of Corporate investment banker Strain: Not on file  Food Insecurity: Not on file  Transportation Needs: Not on file  Physical Activity: Not on file  Stress: Not on file  Social Connections: Not on file  Intimate Partner Violence: Not on file    Family History:     Family History  Problem Relation Age of Onset   Heart disease Mother        stents 36   Asthma Mother    Hearing loss Mother    Hypertension Mother    Heart disease Father    Alzheimer's disease Father        died at 75   Alcohol abuse Father    Cancer Father        leukemia   Cancer Paternal Aunt      ROS:  Please see the history of present illness.  Review of Systems  Constitutional: Negative.  HENT: Negative.    Eyes: Negative.   Cardiovascular:  Positive for chest pain.  Respiratory: Negative.    Endocrine: Negative.   Hematologic/Lymphatic: Negative.   Musculoskeletal: Negative.  Negative for joint pain.  Gastrointestinal: Negative.    Genitourinary: Negative.   Neurological: Negative.    All other ROS reviewed and negative.     Physical Exam/Data:   Vitals:   02/11/22 1600 02/11/22 1648 02/12/22 0521 02/12/22 0908  BP: 128/68 131/86 (!) 118/91 132/83  Pulse: 100 92 72 81  Resp: 15 16 19    Temp:  98.9 F (37.2 C) 97.6 F (36.4 C)   TempSrc:  Oral    SpO2: 99% 99% 100%  Weight:  96.3 kg    Height:  5\' 5"  (1.651 m)      Intake/Output Summary (Last 24 hours) at 02/12/2022 1050 Last data filed at 02/11/2022 1700 Gross per 24 hour  Intake 240 ml  Output --  Net 240 ml      02/11/2022    4:48 PM 02/11/2022   12:02 PM 08/21/2021    8:48 AM  Last 3 Weights  Weight (lbs) 212 lb 4.9 oz 200 lb 215 lb  Weight (kg) 96.3 kg 90.719 kg 97.523 kg     Body mass index is 35.33 kg/m.  General:  Obese, in no acute distress  HEENT: normal Neck: no JVD Vascular: No carotid bruits; Distal pulses 2+ bilaterally Cardiac:  normal S1, S2; RRR; no murmur   Lungs:  clear to auscultation bilaterally, no wheezing, rhonchi or rales  Abd: soft, nontender, no hepatomegaly  Ext: no edema Musculoskeletal:  No deformities, BUE and BLE strength normal and equal Skin: warm and dry  Neuro:  CNs 2-12 intact, no focal abnormalities noted Psych:  Normal affect   EKG:  The EKG was personally reviewed and demonstrates:  sinus tachycardia otherwise normal Telemetry:  Telemetry was personally reviewed and demonstrates:  sinus tachycardia but off tele now  Relevant CV Studies:    Laboratory Data:  High Sensitivity Troponin:   Recent Labs  Lab 02/11/22 1205 02/11/22 1404  TROPONINIHS <2 <2     Chemistry Recent Labs  Lab 02/11/22 1205 02/11/22 1719 02/12/22 0501  NA 137  --  139  K 3.7  --  4.2  CL 106  --  106  CO2 23  --  29  GLUCOSE 112*  --  107*  BUN 15  --  15  CREATININE 0.74 0.66 0.72  CALCIUM 8.8*  --  8.7*  GFRNONAA >60 >60 >60  ANIONGAP 8  --  4*    No results for input(s): PROT, ALBUMIN, AST, ALT, ALKPHOS,  BILITOT in the last 168 hours. Lipids  Recent Labs  Lab 02/12/22 0501  CHOL 139  TRIG 24  HDL 48  LDLCALC 86  CHOLHDL 2.9    Hematology Recent Labs  Lab 02/11/22 1205 02/12/22 0501  WBC 11.1* 8.2  RBC 4.92 4.58  HGB 15.1* 13.7  HCT 44.2 42.0  MCV 89.8 91.7  MCH 30.7 29.9  MCHC 34.2 32.6  RDW 12.3 12.4  PLT 335 281   Thyroid No results for input(s): TSH, FREET4 in the last 168 hours.  BNPNo results for input(s): BNP, PROBNP in the last 168 hours.  DDimer No results for input(s): DDIMER in the last 168 hours.   Radiology/Studies:  DG Chest 2 View  Result Date: 02/11/2022 CLINICAL DATA:  Acute chest pain and shortness of breath EXAM: CHEST - 2 VIEW COMPARISON:  07/21/2021 FINDINGS: The cardiomediastinal silhouette is unremarkable. There is no evidence of focal airspace disease, pulmonary edema, suspicious pulmonary nodule/mass, pleural effusion, or pneumothorax. No acute bony abnormalities are identified. IMPRESSION: No active cardiopulmonary disease. Electronically Signed   By: Harmon Pier M.D.   On: 02/11/2022 12:45     Assessment and Plan:   Chest pain, troponins negative, EKG unchanged but symptoms somewhat worrisome and family history of CAD/HTN. No exertional symptoms. Would be stable to discharge and order Coronary CTA for further evaluation  History of POTS-no recent symptoms  HTN on lisinopril 5 mg daily  Family history of CAD MGM CABG 59's, mother stents 61  Obesity-exercise and weight loss  Hypothyroidism  treated  Tobacco abuse-smoking when with friends and family who smoke  Risk Assessment/Risk Scores:     HEAR Score (for undifferentiated chest pain):  HEAR Score: 4          For questions or updates, please contact [facility] Please consult [email or web address] for contact info under    Signed, Jacolyn Reedy, PA-C  02/12/2022 10:50 AM  Attending note Patient discharged prior to my evaluation. Presented with chest pain. No objective evidence of  ischemia by EKG or enzymes. From chart review I would agree with plan is for outpatient coronary CTA, we will arrange. No charge placed on this consultation.    Dina Rich MD

## 2022-02-12 NOTE — Progress Notes (Signed)
?  Transition of Care (TOC) Screening Note ? ? ?Patient Details  ?Name: [name] ?Date of Birth: July 14, 1966 ? ? ?Transition of Care (TOC) CM/SW Contact:    ?Iona Beard, LCSWA ?Phone Number: ?02/12/2022, 11:20 AM ? ? ? ?Transition of Care Department [facility]) has reviewed patient and no TOC needs have been identified at this time. We will continue to monitor patient advancement through interdisciplinary progression rounds. If new patient transition needs arise, please place a TOC consult. ?  ?

## 2022-02-20 ENCOUNTER — Ambulatory Visit (INDEPENDENT_AMBULATORY_CARE_PROVIDER_SITE_OTHER): Payer: 59 | Admitting: Internal Medicine

## 2022-02-20 ENCOUNTER — Encounter: Payer: Self-pay | Admitting: Internal Medicine

## 2022-02-20 VITALS — BP 118/66 | HR 89 | Ht 65.0 in | Wt 212.2 lb

## 2022-02-20 DIAGNOSIS — R079 Chest pain, unspecified: Secondary | ICD-10-CM | POA: Diagnosis not present

## 2022-02-20 MED ORDER — NITROGLYCERIN 0.4 MG SL SUBL: 0.4000 mg | SUBLINGUAL_TABLET | SUBLINGUAL | 3 refills | Status: DC | PRN

## 2022-02-20 NOTE — Patient Instructions (Signed)
Medication Instructions: ? ?Nitro 0.4 mg as needed  ? ? ?*If you need a refill on your cardiac medications before your next appointment, please call your pharmacy* ? ? ?Lab Work: ? ?If you have labs (blood work) drawn today and your tests are completely normal, you will receive your results only by: ?MyChart Message (if you have MyChart) OR ?A paper copy in the mail ?If you have any lab test that is abnormal or we need to change your treatment, we will call you to review the results. ? ? ?Testing/Procedures: ? ? ? ?Follow-Up: ?At [facility], you and your health needs are our priority.  As part of our continuing mission to provide you with exceptional heart care, we have created designated Provider Care Teams.  These Care Teams include your primary Cardiologist (physician) and Advanced Practice Providers (APPs -  Physician Assistants and Nurse Practitioners) who all work together to provide you with the care you need, when you need it. ? ?We recommend signing up for the patient portal called "MyChart".  Sign up information is provided on this After Visit Summary.  MyChart is used to connect with patients for Virtual Visits (Telemedicine).  Patients are able to view lab/test results, encounter notes, upcoming appointments, etc.  Non-urgent messages can be sent to your provider as well.   ?To learn more about what you can do with MyChart, go to [email or web address].   ? ? ?Important Information About Sugar ? ? ? ? ?  ?

## 2022-02-20 NOTE — Progress Notes (Signed)
? ?Cardiology Office Note ? ? ?Date:  02/20/2022  ? ?ID:  [name], DOB 09/14/1966, MRN [id number] ? ?PCP:  Noreene Larsson, NP  ?Cardiologist:   Dorris Carnes, MD  ? ?Pt presents for eval fo SOB and LE edema   ?  ?History of Present Illness: ?[name] is a 56 y.o. female with a history of POTS  I last saw her in clinic in Nov 2022   ?On 02/11/22 the pt presented to [facility] hosp   She says the day before she was in [place] chest pressure    A squeezing sensation.   Different from chest discomfort from POTS   Discomfort came and went     ?The next day she presented to [facility] hospital   was admitted   Rule out for MI   Set up for CT coronary angiogram.    This is sched for next week ? ?Since seen in ED she has not had any severe episodes of discomfort  ? ?Outpatient Medications Prior to Visit  ?Medication Sig Dispense Refill  ? aspirin EC 81 MG tablet Take 1 tablet (81 mg total) by mouth daily. Swallow whole. 30 tablet 3  ? Cholecalciferol (VITAMIN D3) 125 MCG (5000 UT) CAPS Take 5,000 Units by mouth daily.    ? levothyroxine (SYNTHROID) 100 MCG tablet TAKE 1 TABLET BY MOUTH EVERY DAY DISCONTINUE 112MCG 90 tablet 1  ? lisinopril (ZESTRIL) 5 MG tablet Take 2 tablets (10 mg total) by mouth daily. 90 tablet 3  ? naproxen sodium (ALEVE) 220 MG tablet Take 220 mg by mouth daily as needed (pain).    ? ?No facility-administered medications prior to visit.  ? ? ? ?Allergies:   Other, Penicillins, Latex, and Tape  ? ?Past Medical History:  ?Diagnosis Date  ? Allergy   ? multiple  ? Arthritis   ? back and shoulders  ? Chest pain   ? Educated about COVID-19 virus infection 05/17/2020  ? History of first degree AV block 06/22/2020  ? History of vitamin D deficiency 06/22/2020  ? HTN (hypertension) 06/07/2021  ? Hyperlipidemia   ? Hyperthyroidism   ? HYPOTENSION, ORTHOSTATIC 10/29/2008  ? POTS   ? Pedal edema 12/03/2016  ? Peripheral edema   ? Post-menopausal 12/03/2016  ? POTS (postural orthostatic tachycardia syndrome)   ? Thyroid disease    ? Tobacco abuse 12/03/2016  ? Wears glasses   ? ? ?Past Surgical History:  ?Procedure Laterality Date  ? ARTHROSCOPIC REPAIR ACL  1986  ? right knee  ? KNEE ARTHROPLASTY    ? MASS EXCISION Right 09/30/2014  ? Procedure: 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;  Surgeon: Daryll Brod, MD;  Location: [facility];  Service: Orthopedics;  Laterality: Right;  ? THYROID SURGERY    ? THYROIDECTOMY  2006  ? TILT TABLE STUDY  2007  ? ? ? ?Social History:  The patient  reports that she quit smoking about 10 months ago. Her smoking use included cigarettes. She started smoking about 31 years ago. She has a 6.50 pack-year smoking history. She has been exposed to tobacco smoke. She has never used smokeless tobacco. She reports that she does not drink alcohol and does not use drugs.  ? ?Family History:  The patient's family history includes Alcohol abuse in her father; Alzheimer's disease in her father; Asthma in her mother; Cancer in her father and paternal aunt; Hearing loss in her mother; Heart disease in her father and  mother; Hypertension in her mother.  ? ? ?ROS:  Please see the history of present illness. All other systems are reviewed and  Negative to the above problem except as noted.  ? ? ?PHYSICAL EXAM: ?VS:  BP 118/66   Pulse 89   Ht '5\' 5"'$  (1.651 m)   Wt 212 lb 3.2 oz (96.3 kg)   LMP 09/26/2014   SpO2 94%   BMI 35.31 kg/m?   ?GEN: Obese 56 yo in no acute distress ?HEENT: normal ?Neck: no JVD, carotid bruits,  ?Cardiac: RRR; no murmurs, rubs, or gallops ?Respiratory:  clear to auscultation bilaterally,  ?GI: soft, nontender, nondistended, + BS  No hepatomegaly  ?MS: no deformity Moving all extremities   ?Skin: warm and dry, no rashbe ? ? ?EKG:  EKG is not ordered today.  In ER  SR  ? ?Lipid Panel ?   ?Component Value Date/Time  ? CHOL 139 02/12/2022 0501  ? TRIG 24 02/12/2022 0501  ? HDL 48 02/12/2022 0501  ? CHOLHDL 2.9 02/12/2022 0501  ? VLDL 5 02/12/2022 0501  ? LDLCALC  86 02/12/2022 0501  ? [place] 89 11/25/2018 1008  ? ?  ? ?Wt Readings from Last 3 Encounters:  ?02/20/22 212 lb 3.2 oz (96.3 kg)  ?02/11/22 212 lb 4.9 oz (96.3 kg)  ?08/21/21 215 lb (97.5 kg)  ?  ? ? ?ASSESSMENT AND PLAN: ? ?1  Chest pressure   Atypical but concerning    She denies problems with swallowing food  She has a strong family hx of CAD    ?I agree with sched angiogram     ?Keep on ASA    Add NTG prn    ? ?2  Hx POTS   Pt remains symptom free    Follow    ? ?3  Tob  Remains tobacco free ? ?4  Obesity   Recomm mediterranean style   Low sugar Signed, ?Dorris Carnes, MD  ?02/20/2022 4:39 PM    ?[facility] ?[address], [place], [place]  [address] ?Phone: [phone]; Fax: [phone]  ? ?  ?

## 2022-02-26 ENCOUNTER — Telehealth (HOSPITAL_COMMUNITY): Payer: Self-pay | Admitting: Emergency Medicine

## 2022-02-26 DIAGNOSIS — R079 Chest pain, unspecified: Secondary | ICD-10-CM

## 2022-02-26 MED ORDER — METOPROLOL TARTRATE 100 MG PO TABS: 100.0000 mg | ORAL_TABLET | Freq: Once | ORAL | 0 refills | Status: DC

## 2022-02-26 NOTE — Telephone Encounter (Signed)
Reaching out to patient to offer assistance regarding upcoming cardiac imaging study; pt verbalizes understanding of appt date/time, parking situation and where to check in, pre-test NPO status and medications ordered, and verified current allergies; name and call back number provided for further questions should they arise ?Marchia Bond RN Navigator Cardiac Imaging ?[facility] ?[phone] office ?[phone] cell ? ?Requests ROUND EKG PATCHES ?Arrival 800 ?Denies iv issues ?'100mg'$  metoprolol sent to the pharm ? ?

## 2022-02-28 ENCOUNTER — Ambulatory Visit (HOSPITAL_COMMUNITY)
Admission: RE | Admit: 2022-02-28 | Discharge: 2022-02-28 | Disposition: A | Discharge status: Home / Self Care | Payer: 59 | Source: Ambulatory Visit | Attending: Physician Assistant | Admitting: Physician Assistant

## 2022-02-28 DIAGNOSIS — R072 Precordial pain: Secondary | ICD-10-CM | POA: Insufficient documentation

## 2022-02-28 MED ORDER — IOHEXOL 350 MG/ML SOLN
100.0000 mL | Freq: Once | INTRAVENOUS | Status: AC | PRN
  Administered 2022-02-28: 100 mL via INTRAVENOUS

## 2022-02-28 MED ORDER — NITROGLYCERIN 0.4 MG SL SUBL
SUBLINGUAL_TABLET | SUBLINGUAL | Status: AC
  Filled 2022-02-28: qty 1

## 2022-02-28 MED ORDER — NITROGLYCERIN 0.4 MG SL SUBL
0.8000 mg | SUBLINGUAL_TABLET | Freq: Once | SUBLINGUAL | Status: AC
  Administered 2022-02-28: 0.8 mg via SUBLINGUAL

## 2022-02-28 MED ORDER — NITROGLYCERIN 0.4 MG SL SUBL
SUBLINGUAL_TABLET | SUBLINGUAL | Status: AC
  Filled 2022-02-28: qty 2

## 2022-03-22 ENCOUNTER — Ambulatory Visit (INDEPENDENT_AMBULATORY_CARE_PROVIDER_SITE_OTHER): Payer: 59 | Admitting: Family Medicine

## 2022-03-22 ENCOUNTER — Encounter: Payer: Self-pay | Admitting: Family Medicine

## 2022-03-22 VITALS — BP 111/76 | HR 104 | Temp 97.6°F | Ht 65.0 in | Wt 213.8 lb

## 2022-03-22 DIAGNOSIS — I10 Essential (primary) hypertension: Secondary | ICD-10-CM

## 2022-03-22 DIAGNOSIS — E890 Postprocedural hypothyroidism: Secondary | ICD-10-CM | POA: Diagnosis not present

## 2022-03-22 DIAGNOSIS — Z0000 Encounter for general adult medical examination without abnormal findings: Secondary | ICD-10-CM

## 2022-03-22 DIAGNOSIS — Z1211 Encounter for screening for malignant neoplasm of colon: Secondary | ICD-10-CM

## 2022-03-22 NOTE — Progress Notes (Signed)
New Patient Office Visit  Assessment & Plan:  1. Essential hypertension Well controlled on current regimen.   2. Postoperative hypothyroidism Well controlled on current regimen.   3. Morbid obesity ([place]) Encouraged healthy eating and exercise.  4. Colon cancer screening - Cologuard  5. Healthcare maintenance Patient declines hepatitis C screening, COVID booster, Shingrix, and Pap smear at this time.  Encouraged to schedule her mammogram.   Follow-up: Return in about 3 months (around 06/22/2022) for annual physical.   Hendricks Limes, MSN, APRN, FNP-C [name]  Subjective:  Patient ID: [name], female    DOB: [birth year]  Age: 56 y.o. MRN: [id number]  Patient Care Team: Audrey Brooklyn, FNP as PCP - General (Family Medicine)  CC:  Chief Complaint  Patient presents with   New Patient (Initial Visit)    [facility]    Establish Care    HPI [name] presents to establish care.   Hypertension: Taking lisinopril 5 mg daily.  She is monitoring her blood pressure at home and reports readings <130/90.   Hypothyroidism: S/P thyroidectomy in 2006.  Taking levothyroxine 100 mcg daily.  Last dose change was in June 2021 at which time she was decreased from 112 mcg down to 100 mcg daily.   Review of Systems  Constitutional:  Negative for chills, fever, malaise/fatigue and weight loss.  HENT:  Negative for congestion, ear discharge, ear pain, nosebleeds, sinus pain, sore throat and tinnitus.   Eyes:  Negative for blurred vision, double vision, pain, discharge and redness.  Respiratory:  Negative for cough, shortness of breath and wheezing.   Cardiovascular:  Negative for chest pain, palpitations and leg swelling.  Gastrointestinal:  Negative for abdominal pain, constipation, diarrhea, heartburn, nausea and vomiting.  Genitourinary:  Negative for dysuria, frequency and urgency.  Musculoskeletal:  Positive for joint pain. Negative for  myalgias.  Skin:  Negative for rash.  Neurological:  Negative for dizziness, seizures, weakness and headaches.  Psychiatric/Behavioral:  Negative for depression, substance abuse and suicidal ideas. The patient is not nervous/anxious.     Current Outpatient Medications:    Cholecalciferol (VITAMIN D3) 125 MCG (5000 UT) CAPS, Take 5,000 Units by mouth daily., Disp: , Rfl:    levothyroxine (SYNTHROID) 100 MCG tablet, TAKE 1 TABLET BY MOUTH EVERY DAY DISCONTINUE 112MCG, Disp: 90 tablet, Rfl: 1   lisinopril (ZESTRIL) 5 MG tablet, Take 2 tablets (10 mg total) by mouth daily. (Patient taking differently: Take 5 mg by mouth daily.), Disp: 90 tablet, Rfl: 3   naproxen sodium (ALEVE) 220 MG tablet, Take 220 mg by mouth daily as needed (pain)., Disp: , Rfl:    nitroGLYCERIN (NITROSTAT) 0.4 MG SL tablet, Place 1 tablet (0.4 mg total) under the tongue every 5 (five) minutes as needed for chest pain., Disp: 25 tablet, Rfl: 3   aspirin EC 81 MG tablet, Take 1 tablet (81 mg total) by mouth daily. Swallow whole. (Patient not taking: Reported on 03/22/2022), Disp: 30 tablet, Rfl: 3  Allergies  Allergen Reactions   Other Other (See Comments)    Ink (Hives,Rash) Rubber ("big blisters that bust") Leather (Hives,Rash)   Penicillins Other (See Comments) and Hives    REACTION: unknown/childhood   Latex Rash    REACTION: Also allergic to all rubbers, leathers with moisture and direct contact    Tape Rash    EKG stickers cause rash    Past Medical History:  Diagnosis Date   Allergy    multiple   Arthritis  back and shoulders   Chest pain    History of first degree AV block 06/22/2020   History of vitamin D deficiency 06/22/2020   HTN (hypertension) 06/07/2021   Hyperlipidemia    Hyperthyroidism    HYPOTENSION, ORTHOSTATIC 10/29/2008   POTS    Pedal edema 12/03/2016   Post-menopausal 12/03/2016   POTS (postural orthostatic tachycardia syndrome)    Tobacco abuse 12/03/2016    Past Surgical  History:  Procedure Laterality Date   ARTHROSCOPIC REPAIR ACL  10/15/1984   right knee   KNEE ARTHROPLASTY     MASS EXCISION Right 09/30/2014   Procedure: 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;  Surgeon: Audrey Brod, MD;  Location: [facility];  Service: Orthopedics;  Laterality: Right;   THYROIDECTOMY  10/15/2004   TILT TABLE STUDY  10/15/2005    Family History  Problem Relation Age of Onset   Heart disease Mother        stents 64   Asthma Mother    Hearing loss Mother    Hypertension Mother    Heart disease Father    Alzheimer's disease Father    Alcohol abuse Father    Leukemia Father    Heart disease Maternal Grandmother    Cancer Paternal Aunt     Social History   Socioeconomic History   Marital status: Married    Spouse name: Not on file   Number of children: 0   Years of education: 13   Highest education level: Associate degree: occupational, Hotel manager, or vocational program  Occupational History   Occupation: parts specialist     Comment: [employer]   Tobacco Use   Smoking status: Former    Packs/day: 0.25    Years: 26.00    Total pack years: 6.50    Types: Cigarettes    Start date: 10/15/1990    Quit date: 03/26/2021    Years since quitting: 0.9    Passive exposure: Current   Smokeless tobacco: Never  Vaping Use   Vaping Use: Never used  Substance and Sexual Activity   Alcohol use: No    Comment: very rare   Drug use: No   Sexual activity: Yes  Other Topics Concern   Not on file  Social History Narrative   Divorced   Lives ex husband-relationship is good friends   Metallurgist   [employer]      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 - [place] Strain: Not on file  Food Insecurity: Not on file  Transportation Needs: Not on file  Physical Activity: Inactive (11/25/2018)   Exercise Vital Sign    Days of Exercise per  Week: 0 days    Minutes of Exercise per Session: 0 min  Stress: Not on file  Social Connections: Moderately Isolated (11/25/2018)   Social Connection and Isolation Panel [NHANES]    Frequency of Communication with Friends and Family: More than three times a week    Frequency of Social Gatherings with Friends and Family: Three times a week    Attends Religious Services: Never    Active Member of Clubs or Organizations: No    Attends Archivist Meetings: Never    Marital Status: Divorced  Human resources officer Violence: Not on file    Objective:   Today's Vitals: BP 111/76   Pulse (!) 104   Temp 97.6 F (36.4 C) (Temporal)   Ht '5\' 5"'$  (  1.651 m)   Wt 213 lb 12.8 oz (97 kg)   LMP 09/26/2014   SpO2 96%   BMI 35.58 kg/m   Physical Exam Vitals reviewed.  Constitutional:      General: She is not in acute distress.    Appearance: Normal appearance. She is obese. She is not ill-appearing, toxic-appearing or diaphoretic.  HENT:     Head: Normocephalic and atraumatic.  Eyes:     General: No scleral icterus.       Right eye: No discharge.        Left eye: No discharge.     Conjunctiva/sclera: Conjunctivae normal.  Cardiovascular:     Rate and Rhythm: Normal rate and regular rhythm.     Heart sounds: Normal heart sounds. No murmur heard.    No friction rub. No gallop.  Pulmonary:     Effort: Pulmonary effort is normal. No respiratory distress.     Breath sounds: Normal breath sounds. No stridor. No wheezing, rhonchi or rales.  Musculoskeletal:        General: Normal range of motion.     Cervical back: Normal range of motion.  Skin:    General: Skin is warm and dry.     Capillary Refill: Capillary refill takes less than 2 seconds.  Neurological:     General: No focal deficit present.     Mental Status: She is alert and oriented to person, place, and time. Mental status is at baseline.  Psychiatric:        Mood and Affect: Mood normal.        Behavior: Behavior normal.         Thought Content: Thought content normal.        Judgment: Judgment normal.

## 2022-03-23 ENCOUNTER — Encounter: Payer: Self-pay | Admitting: Family Medicine

## 2022-04-05 ENCOUNTER — Other Ambulatory Visit: Payer: 59

## 2022-04-18 ENCOUNTER — Telehealth: Payer: Self-pay | Admitting: Family Medicine

## 2022-04-18 DIAGNOSIS — E039 Hypothyroidism, unspecified: Secondary | ICD-10-CM

## 2022-04-18 MED ORDER — LEVOTHYROXINE SODIUM 100 MCG PO TABS: 100.0000 ug | ORAL_TABLET | Freq: Every day | ORAL | 0 refills | Status: DC

## 2022-04-18 NOTE — Telephone Encounter (Signed)
80-monthsupply sent. Please remind patient to schedule her CPE for September.

## 2022-04-18 NOTE — Telephone Encounter (Signed)
  Prescription Request  04/18/2022  Is this a "Controlled Substance" medicine? no  Have you seen your PCP in the last 2 weeks? Last seen on 03/22/2022  If YES, route message to pool  -  If NO, patient needs to be scheduled for appointment.  What is the name of the medication or equipment? levothyroxine (SYNTHROID) 100 MCG tablet  Have you contacted your pharmacy to request a refill? yes   Which pharmacy would you like this sent to? [facility] [address]   Patient notified that their request is being sent to the clinical staff for review and that they should receive a response within 2 business days.

## 2022-04-19 NOTE — Telephone Encounter (Signed)
Patient aware and verbalizes understanding. 

## 2022-04-30 LAB — COLOGUARD: COLOGUARD: NEGATIVE

## 2022-06-28 ENCOUNTER — Ambulatory Visit (INDEPENDENT_AMBULATORY_CARE_PROVIDER_SITE_OTHER): Payer: 59 | Admitting: Family Medicine

## 2022-06-28 ENCOUNTER — Encounter: Payer: Self-pay | Admitting: Family Medicine

## 2022-06-28 VITALS — BP 125/84 | HR 83 | Temp 96.8°F | Resp 20 | Ht 65.0 in | Wt 212.0 lb

## 2022-06-28 DIAGNOSIS — E890 Postprocedural hypothyroidism: Secondary | ICD-10-CM

## 2022-06-28 DIAGNOSIS — I10 Essential (primary) hypertension: Secondary | ICD-10-CM | POA: Diagnosis not present

## 2022-06-28 DIAGNOSIS — E669 Obesity, unspecified: Secondary | ICD-10-CM

## 2022-06-28 DIAGNOSIS — E559 Vitamin D deficiency, unspecified: Secondary | ICD-10-CM

## 2022-06-28 MED ORDER — LISINOPRIL 5 MG PO TABS: 5.0000 mg | ORAL_TABLET | Freq: Every day | ORAL | 1 refills | Status: DC

## 2022-06-28 MED ORDER — LEVOTHYROXINE SODIUM 100 MCG PO TABS: 100.0000 ug | ORAL_TABLET | Freq: Every day | ORAL | 1 refills | Status: DC

## 2022-06-28 NOTE — Progress Notes (Signed)
Assessment & Plan:  1. Postoperative hypothyroidism Well controlled on current regimen.  - levothyroxine (SYNTHROID) 100 MCG tablet; Take 1 tablet (100 mcg total) by mouth daily before breakfast.  Dispense: 90 tablet; Refill: 1 - TSH - T4, free  2. Essential hypertension Well controlled on current regimen.  - CBC with Differential/Platelet - CMP14+EGFR - lisinopril (ZESTRIL) 5 MG tablet; Take 1 tablet (5 mg total) by mouth daily.  Dispense: 90 tablet; Refill: 1  3. Obesity (BMI 30-39.9) Encouraged healthy eating and exercise. - CBC with Differential/Platelet - CMP14+EGFR - TSH  4. Vitamin D deficiency Well controlled on current regimen.  - VITAMIN D 25 Hydroxy (Vit-D Deficiency, Fractures)   Return as scheduled.  Hendricks Limes, MSN, APRN, FNP-C [facility]  Subjective:    Patient ID: [name], female    DOB: 1966/09/08, 56 y.o.   MRN: [id number]  Patient Care Team: Loman Brooklyn, FNP as PCP - General (Family Medicine)   Chief Complaint:  Chief Complaint  Patient presents with   Medical Management of Chronic Issues    HPI: [name] is a 56 y.o. female presenting on 06/28/2022 for Medical Management of Chronic Issues  Hypertension: Taking lisinopril 5 mg daily.  She is monitoring her blood pressure at home and reports readings 130s/80s without medication and less with medication.   Hypothyroidism: S/P thyroidectomy in 2006.  Taking levothyroxine 100 mcg daily.  Last dose change was in June 2021 at which time she was decreased from 112 mcg down to 100 mcg daily.  Vitamin D deficiency: Taking vitamin D 5,000 units once daily.  New complaints: None   Social history:  Relevant past medical, surgical, family and social history reviewed and updated as indicated. Interim medical history since our last visit reviewed.  Allergies and medications reviewed and updated.  DATA REVIEWED: CHART IN EPIC  ROS: Negative unless specifically  indicated above in HPI.    Current Outpatient Medications:    Cholecalciferol (VITAMIN D3) 125 MCG (5000 UT) CAPS, Take 5,000 Units by mouth daily., Disp: , Rfl:    levothyroxine (SYNTHROID) 100 MCG tablet, Take 1 tablet (100 mcg total) by mouth daily before breakfast., Disp: 90 tablet, Rfl: 0   lisinopril (ZESTRIL) 5 MG tablet, Take 2 tablets (10 mg total) by mouth daily. (Patient taking differently: Take 5 mg by mouth daily.), Disp: 90 tablet, Rfl: 3   naproxen sodium (ALEVE) 220 MG tablet, Take 220 mg by mouth daily as needed (pain). (Patient not taking: Reported on 06/28/2022), Disp: , Rfl:    nitroGLYCERIN (NITROSTAT) 0.4 MG SL tablet, Place 1 tablet (0.4 mg total) under the tongue every 5 (five) minutes as needed for chest pain. (Patient not taking: Reported on 06/28/2022), Disp: 25 tablet, Rfl: 3   Allergies  Allergen Reactions   Other Other (See Comments)    Ink (Hives,Rash) Rubber ("big blisters that bust") Leather (Hives,Rash)   Penicillins Other (See Comments) and Hives    REACTION: unknown/childhood   Latex Rash    REACTION: Also allergic to all rubbers, leathers with moisture and direct contact    Tape Rash    EKG stickers cause rash   Past Medical History:  Diagnosis Date   Allergy    multiple   Arthritis    back and shoulders   Chest pain    History of first degree AV block 06/22/2020   History of vitamin D deficiency 06/22/2020   HTN (hypertension) 06/07/2021   Hyperthyroidism    HYPOTENSION, ORTHOSTATIC  10/29/2008   POTS    Pedal edema 12/03/2016   Post-menopausal 12/03/2016   POTS (postural orthostatic tachycardia syndrome)    Tobacco abuse 12/03/2016    Past Surgical History:  Procedure Laterality Date   ARTHROSCOPIC REPAIR ACL  10/15/1984   right knee   KNEE ARTHROPLASTY     MASS EXCISION Right 09/30/2014   Procedure: 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;  Surgeon: Daryll Brod, MD;  Location: [facility];   Service: Orthopedics;  Laterality: Right;   THYROIDECTOMY  10/15/2004   TILT TABLE STUDY  10/15/2005    Social History   Socioeconomic History   Marital status: Married    Spouse name: Not on file   Number of children: 0   Years of education: 13   Highest education level: Associate degree: occupational, Hotel manager, or vocational program  Occupational History   Occupation: parts specialist     Comment: [employer]   Tobacco Use   Smoking status: Former    Packs/day: 0.25    Years: 26.00    Total pack years: 6.50    Types: Cigarettes    Start date: 10/15/1990    Quit date: 03/26/2021    Years since quitting: 1.2    Passive exposure: Current   Smokeless tobacco: Never  Vaping Use   Vaping Use: Never used  Substance and Sexual Activity   Alcohol use: No    Comment: very rare   Drug use: No   Sexual activity: Yes  Other Topics Concern   Not on file  Social History Narrative   Divorced   Lives ex husband-relationship is good friends   [place] [employer]   [facility]      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 - [place] Strain: [place]  (11/25/2018)   Overall Financial Resource Strain (CARDIA)    Difficulty of Paying Living Expenses: Not hard at all  Food Insecurity: No [place] (11/25/2018)   Hunger Vital Sign    Worried About [place] in the Last Year: Never true    [place] in the Last Year: Never true  Transportation Needs: No Transportation Needs (11/25/2018)   PRAPARE - Hydrologist (Medical): No    Lack of Transportation (Non-Medical): No  Physical Activity: Inactive (11/25/2018)   Exercise Vital Sign    Days of Exercise per Week: 0 days    Minutes of Exercise per Session: 0 min  Stress: No Stress Concern Present (11/25/2018)   [place]    Feeling of Stress : Only a  little  Social Connections: Moderately Isolated (11/25/2018)   Social Connection and Isolation Panel [NHANES]    Frequency of Communication with Friends and Family: More than three times a week    Frequency of Social Gatherings with Friends and Family: Three times a week    Attends Religious Services: Never    Active Member of Clubs or Organizations: No    Attends Archivist Meetings: Never    Marital Status: Divorced  Human resources officer Violence: Not At Risk (11/25/2018)   Humiliation, Afraid, Rape, and Kick questionnaire    Fear of Current or Ex-Partner: No    Emotionally Abused: No    Physically Abused: No    Sexually Abused: No        Objective:    BP  125/84   Pulse 83   Temp (!) 96.8 F (36 C)   Resp 20   Ht 5' 5"  (1.651 m)   Wt 212 lb (96.2 kg)   LMP 09/26/2014   SpO2 98%   BMI 35.28 kg/m   Wt Readings from Last 3 Encounters:  06/28/22 212 lb (96.2 kg)  03/22/22 213 lb 12.8 oz (97 kg)  02/20/22 212 lb 3.2 oz (96.3 kg)    Physical Exam Vitals reviewed.  Constitutional:      General: She is not in acute distress.    Appearance: Normal appearance. She is obese. She is not ill-appearing, toxic-appearing or diaphoretic.  HENT:     Head: Normocephalic and atraumatic.  Eyes:     General: No scleral icterus.       Right eye: No discharge.        Left eye: No discharge.     Conjunctiva/sclera: Conjunctivae normal.  Cardiovascular:     Rate and Rhythm: Normal rate and regular rhythm.     Heart sounds: Normal heart sounds. No murmur heard.    No friction rub. No gallop.  Pulmonary:     Effort: Pulmonary effort is normal. No respiratory distress.     Breath sounds: Normal breath sounds. No stridor. No wheezing, rhonchi or rales.  Musculoskeletal:        General: Normal range of motion.     Cervical back: Normal range of motion.  Skin:    General: Skin is warm and dry.     Capillary Refill: Capillary refill takes less than 2 seconds.  Neurological:      General: No focal deficit present.     Mental Status: She is alert and oriented to person, place, and time. Mental status is at baseline.  Psychiatric:        Mood and Affect: Mood normal.        Behavior: Behavior normal.        Thought Content: Thought content normal.        Judgment: Judgment normal.     Lab Results  Component Value Date   TSH 2.390 05/22/2021   Lab Results  Component Value Date   WBC 8.2 02/12/2022   HGB 13.7 02/12/2022   HCT 42.0 02/12/2022   MCV 91.7 02/12/2022   PLT 281 02/12/2022   Lab Results  Component Value Date   NA 139 02/12/2022   K 4.2 02/12/2022   CO2 29 02/12/2022   GLUCOSE 107 (H) 02/12/2022   BUN 15 02/12/2022   CREATININE 0.72 02/12/2022   BILITOT 0.4 12/20/2020   ALKPHOS 109 12/20/2020   AST 18 12/20/2020   ALT 31 12/20/2020   PROT 6.6 12/20/2020   ALBUMIN 4.3 12/20/2020   CALCIUM 8.7 (L) 02/12/2022   ANIONGAP 4 (L) 02/12/2022   EGFR 91 12/20/2020   Lab Results  Component Value Date   CHOL 139 02/12/2022   Lab Results  Component Value Date   HDL 48 02/12/2022   Lab Results  Component Value Date   LDLCALC 86 02/12/2022   Lab Results  Component Value Date   TRIG 24 02/12/2022   Lab Results  Component Value Date   CHOLHDL 2.9 02/12/2022   Lab Results  Component Value Date   HGBA1C 5.6 06/27/2020

## 2022-06-29 LAB — CBC WITH DIFFERENTIAL/PLATELET
Basophils Absolute: 0.1 10*3/uL (ref 0.0–0.2)
Basos: 1 %
EOS (ABSOLUTE): 0.4 10*3/uL (ref 0.0–0.4)
Eos: 5 %
Hematocrit: 45.8 % (ref 34.0–46.6)
Hemoglobin: 15.5 g/dL (ref 11.1–15.9)
Immature Grans (Abs): 0 10*3/uL (ref 0.0–0.1)
Immature Granulocytes: 0 %
Lymphocytes Absolute: 3.4 10*3/uL — ABNORMAL HIGH (ref 0.7–3.1)
Lymphs: 36 %
MCH: 30.2 pg (ref 26.6–33.0)
MCHC: 33.8 g/dL (ref 31.5–35.7)
MCV: 89 fL (ref 79–97)
Monocytes Absolute: 0.6 10*3/uL (ref 0.1–0.9)
Monocytes: 6 %
Neutrophils Absolute: 5 10*3/uL (ref 1.4–7.0)
Neutrophils: 52 %
Platelets: 333 10*3/uL (ref 150–450)
RBC: 5.13 x10E6/uL (ref 3.77–5.28)
RDW: 12.1 % (ref 11.7–15.4)
WBC: 9.6 10*3/uL (ref 3.4–10.8)

## 2022-06-29 LAB — CMP14+EGFR
ALT: 28 IU/L (ref 0–32)
AST: 21 IU/L (ref 0–40)
Albumin/Globulin Ratio: 1.9 (ref 1.2–2.2)
Albumin: 4.6 g/dL (ref 3.8–4.9)
Alkaline Phosphatase: 114 IU/L (ref 44–121)
BUN/Creatinine Ratio: 10 (ref 9–23)
BUN: 8 mg/dL (ref 6–24)
Bilirubin Total: 0.5 mg/dL (ref 0.0–1.2)
CO2: 25 mmol/L (ref 20–29)
Calcium: 9.8 mg/dL (ref 8.7–10.2)
Chloride: 102 mmol/L (ref 96–106)
Creatinine, Ser: 0.82 mg/dL (ref 0.57–1.00)
Globulin, Total: 2.4 g/dL (ref 1.5–4.5)
Glucose: 94 mg/dL (ref 70–99)
Potassium: 4.8 mmol/L (ref 3.5–5.2)
Sodium: 143 mmol/L (ref 134–144)
Total Protein: 7 g/dL (ref 6.0–8.5)
eGFR: 84 mL/min/{1.73_m2} (ref >59)

## 2022-06-29 LAB — VITAMIN D 25 HYDROXY (VIT D DEFICIENCY, FRACTURES): Vit D, 25-Hydroxy: 56 ng/mL (ref 30.0–100.0)

## 2022-06-29 LAB — T4, FREE: Free T4: 1.3 ng/dL (ref 0.82–1.77)

## 2022-06-29 LAB — TSH: TSH: 2.15 u[IU]/mL (ref 0.450–4.500)

## 2022-08-20 ENCOUNTER — Encounter: Payer: 59 | Admitting: Family Medicine

## 2022-11-02 ENCOUNTER — Encounter: Payer: Self-pay | Admitting: Family Medicine

## 2022-11-02 ENCOUNTER — Ambulatory Visit (INDEPENDENT_AMBULATORY_CARE_PROVIDER_SITE_OTHER): Payer: 59 | Admitting: Family Medicine

## 2022-11-02 VITALS — BP 124/75 | HR 86 | Temp 98.2°F | Ht 65.0 in | Wt 217.0 lb

## 2022-11-02 DIAGNOSIS — I10 Essential (primary) hypertension: Secondary | ICD-10-CM | POA: Diagnosis not present

## 2022-11-02 DIAGNOSIS — R350 Frequency of micturition: Secondary | ICD-10-CM | POA: Diagnosis not present

## 2022-11-02 DIAGNOSIS — E890 Postprocedural hypothyroidism: Secondary | ICD-10-CM

## 2022-11-02 DIAGNOSIS — N760 Acute vaginitis: Secondary | ICD-10-CM

## 2022-11-02 LAB — URINALYSIS, ROUTINE W REFLEX MICROSCOPIC
Bilirubin, UA: NEGATIVE
Glucose, UA: NEGATIVE
Ketones, UA: NEGATIVE
Leukocytes,UA: NEGATIVE
Nitrite, UA: NEGATIVE
Protein,UA: NEGATIVE
RBC, UA: NEGATIVE
Specific Gravity, UA: 1.02 (ref 1.005–1.030)
Urobilinogen, Ur: 1 mg/dL (ref 0.2–1.0)
pH, UA: 7 (ref 5.0–7.5)

## 2022-11-02 LAB — WET PREP FOR TRICH, YEAST, CLUE
Clue Cell Exam: NEGATIVE
Trichomonas Exam: NEGATIVE
Yeast Exam: NEGATIVE

## 2022-11-02 NOTE — Progress Notes (Signed)
Established Patient Office Visit  Subjective   Patient ID: [name], female    DOB: May 28, 1966  Age: 56 y.o. MRN: [id number]  Chief Complaint  Patient presents with   Medical Management of Chronic Issues   Hypothyroidism    HPI  HTN Complaint with meds - Yes Current Medications - lisinopril  Pertinent ROS:  Visual Disturbances - No Chest pain - No Dyspnea - No Palpitations - No LE edema - No  2. Thyroid Compliant with medications - Yes Current medications - levothyroxine 100 mg daily Weight - stable  Bowel habit changes - No Fatigue - No Skin, hair, or nail changes - No Tremor - No Palpitations - No Edema - No Shortness of breath - No  3. Acute vaginitis She reports vaginal itching and burning for about 2 weeks. She also reports urinary frequency. She is not sexually active. Denies other urinary or vaginal symptoms. Denies fever, flank pain.     ROS    Objective:     BP 124/75   Pulse 86   Temp 98.2 F (36.8 C) (Temporal)   Ht '5\' 5"'$  (1.651 m)   Wt 217 lb (98.4 kg)   LMP 09/26/2014   SpO2 97%   BMI 36.11 kg/m  Wt Readings from Last 3 Encounters:  11/02/22 217 lb (98.4 kg)  06/28/22 212 lb (96.2 kg)  03/22/22 213 lb 12.8 oz (97 kg)      Physical Exam Vitals and nursing note reviewed.  Constitutional:      General: She is not in acute distress.    Appearance: She is obese. She is not ill-appearing, toxic-appearing or diaphoretic.  Neck:     Thyroid: No thyroid mass, thyromegaly or thyroid tenderness.  Cardiovascular:     Rate and Rhythm: Normal rate and regular rhythm.     Heart sounds: Normal heart sounds. No murmur heard. Pulmonary:     Effort: Pulmonary effort is normal.     Breath sounds: Normal breath sounds.  Abdominal:     General: Bowel sounds are normal. There is no distension.     Palpations: Abdomen is soft.     Tenderness: There is no abdominal tenderness. There is no guarding or rebound.  Musculoskeletal:     Cervical  back: No rigidity.     Right lower leg: No edema.     Left lower leg: No edema.  Skin:    General: Skin is warm and dry.  Neurological:     General: No focal deficit present.     Mental Status: She is alert.  Psychiatric:        Mood and Affect: Mood normal.        Behavior: Behavior normal.        Thought Content: Thought content normal.        Judgment: Judgment normal.    No results found for any visits on 11/02/22.    The 10-year ASCVD risk score (Arnett DK, et al., 2019) is: 2%    Assessment & Plan:   [name] was seen today for medical management of chronic issues and hypothyroidism.  Diagnoses and all orders for this visit:  Primary hypertension Well controlled on current regimen. Continue lisinopril. Reviewed recent labs.   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 TSH pending.  -     TSH -     T4, Free  Morbid obesity (HCC) Diet and exercise.   Urinary frequency Negative UA today. Discussed hydration, reduction in caffeine  intake.  -     Urinalysis, Routine w reflex microscopic  Acute vaginitis Negative wet prep. Discussed may be atrophic vaginitis due to menopause. Declined exam today. Not sexually active.  -     WET PREP FOR [place]  Return in about 6 months (around 05/03/2023) for CPE with pap, labs.   The patient indicates understanding of these issues and agrees with the plan.  Gwenlyn Perking, FNP

## 2022-11-02 NOTE — Patient Instructions (Signed)
Atrophic Vaginitis  Atrophic vaginitis is a condition in which the tissues that line the vagina become dry and thin. This condition is most common in women who have stopped having regular menstrual periods (are in menopause). This usually starts when a woman is 45 to 55 years old. That is the time when a woman's estrogen levels begin to decrease. Estrogen is a female hormone. It helps to keep the tissues of the vagina moist. It stimulates the vagina to produce a clear fluid that lubricates the vagina for sex. This fluid also protects the vagina from infection. Lack of estrogen can cause the lining of the vagina to get thinner and dryer. The vagina may also shrink in size. It may become less elastic. Atrophic vaginitis tends to get worse over time as a woman's estrogen level drops. What are the causes? This condition is caused by the normal drop in estrogen that happens around the time of menopause. What increases the risk? Certain conditions or situations may lower a woman's estrogen level, leading to a higher risk for atrophic vaginitis. You are more likely to develop this condition if: You are taking medicines that block estrogen. You have had your ovaries removed. You are being treated for cancer with radiation or medicines (chemotherapy). You have given birth or are breastfeeding. You are older than age 50. You smoke. What are the signs or symptoms? Symptoms of this condition include: Pain, soreness, a feeling of pressure, or bleeding during sex (dyspareunia). Vaginal burning, irritation, or itching. Pain or bleeding when a speculum is used in a vaginal exam. Having burning pain while urinating. Vaginal discharge. In some cases, there are no symptoms. How is this diagnosed? This condition is diagnosed based on your medical history and a physical exam. This will include a pelvic exam that checks the vaginal tissues. Though rare, you may also have other tests, including: A urine test. A  test that checks the acid balance in your vagina (acid balance test). How is this treated? Treatment for this condition depends on how severe your symptoms are. Treatment may include: Using an over-the-counter vaginal lubricant before sex. Using a long-acting vaginal moisturizer. Using low-dose estrogen for moderate to severe symptoms that do not respond to other treatments. Options include creams, tablets, and inserts (vaginal rings). Before you use a vaginal estrogen, tell your health care provider if you have a history of: Breast cancer. Endometrial cancer. Blood clots. If you are not sexually active and your symptoms are very mild, you may not need treatment. Follow these instructions at home: Medicines Take over-the-counter and prescription medicines only as told by your health care provider. Do not use herbal or alternative medicines unless your health care provider says that you can. Use over-the-counter creams, lubricants, or moisturizers for dryness only as told by your health care provider. General instructions If your atrophic vaginitis is caused by menopause, discuss all of your menopause symptoms and treatment options with your health care provider. Do not douche. Do not use products that can make your vagina dry. These include: Scented feminine sprays. Scented tampons. Scented soaps. Vaginal sex can help to improve blood flow and elasticity of vaginal tissue. If you choose to have sex and it hurts, try using a water-soluble lubricant or moisturizer right before having sex. Contact a health care provider if: Your discharge looks different than normal. Your vagina has an unusual smell. You have new symptoms. Your symptoms do not improve with treatment. Your symptoms get worse. Summary Atrophic vaginitis is a condition   in which the tissues that line the vagina become dry and thin. It is most common in women who have stopped having regular menstrual periods (are in  menopause). Treatment options include using vaginal lubricants and low-dose vaginal estrogen. Contact a health care provider if your vagina has an unusual smell, or if your symptoms get worse or do not improve after treatment. This information is not intended to replace advice given to you by your health care provider. Make sure you discuss any questions you have with your health care provider. Document Revised: 03/31/2020 Document Reviewed: 03/31/2020 Elsevier Patient Education  2023 Elsevier Inc.  

## 2022-11-03 LAB — T4, FREE: Free T4: 1.53 ng/dL (ref 0.82–1.77)

## 2022-11-03 LAB — TSH: TSH: 1.35 u[IU]/mL (ref 0.450–4.500)

## 2022-11-05 ENCOUNTER — Other Ambulatory Visit: Payer: Self-pay | Admitting: Family Medicine

## 2022-11-05 DIAGNOSIS — I10 Essential (primary) hypertension: Secondary | ICD-10-CM

## 2022-11-05 DIAGNOSIS — E890 Postprocedural hypothyroidism: Secondary | ICD-10-CM

## 2022-11-05 MED ORDER — LISINOPRIL 5 MG PO TABS: 5.0000 mg | ORAL_TABLET | Freq: Every day | ORAL | 3 refills | Status: DC

## 2022-11-05 MED ORDER — LEVOTHYROXINE SODIUM 100 MCG PO TABS: 100.0000 ug | ORAL_TABLET | Freq: Every day | ORAL | 3 refills | Status: DC

## 2022-12-13 ENCOUNTER — Encounter: Payer: Self-pay | Admitting: Radiology

## 2022-12-25 ENCOUNTER — Other Ambulatory Visit: Payer: Self-pay | Admitting: Family Medicine

## 2022-12-25 DIAGNOSIS — E890 Postprocedural hypothyroidism: Secondary | ICD-10-CM

## 2023-05-02 ENCOUNTER — Encounter: Payer: 59 | Admitting: Family Medicine

## 2023-07-01 ENCOUNTER — Ambulatory Visit
Admission: EM | Admit: 2023-07-01 | Discharge: 2023-07-01 | Disposition: A | Discharge status: Home / Self Care | Payer: 59 | Attending: Family Medicine | Admitting: Family Medicine

## 2023-07-01 DIAGNOSIS — S46912A Strain of unspecified muscle, fascia and tendon at shoulder and upper arm level, left arm, initial encounter: Secondary | ICD-10-CM

## 2023-07-01 MED ORDER — PREDNISONE 20 MG PO TABS: 40.0000 mg | ORAL_TABLET | Freq: Every day | ORAL | 0 refills | Status: DC

## 2023-07-01 MED ORDER — TIZANIDINE HCL 4 MG PO CAPS: 4.0000 mg | ORAL_CAPSULE | Freq: Three times a day (TID) | ORAL | 0 refills | Status: DC | PRN

## 2023-07-01 NOTE — ED Provider Notes (Signed)
[facility]    CSN: [id number] Arrival date & time: 07/01/23  1543      History   Chief Complaint No chief complaint on file.   HPI [name] is a 57 y.o. female.   Presenting today with 1 day history of left shoulder and upper back pain after clutching a shelf trying to reach a heavy dog food bag in the back.  States some tingling down to the hand and radiation of pain down toward the elbow.  Denies swelling, discoloration, loss of range of motion, neck pain, headache.  So far trying muscle rubs, over-the-counter pain relievers with minimal relief.    Past Medical History:  Diagnosis Date   Allergy    multiple   Arthritis    back and shoulders   Chest pain    History of first degree AV block 06/22/2020   History of vitamin D deficiency 06/22/2020   HTN (hypertension) 06/07/2021   Hyperthyroidism    HYPOTENSION, ORTHOSTATIC 10/29/2008   POTS    Pedal edema 12/03/2016   Post-menopausal 12/03/2016   POTS (postural orthostatic tachycardia syndrome)    Tobacco abuse 12/03/2016    Patient Active Problem List   Diagnosis Date Noted   Urinary frequency 11/02/2022   Morbid obesity (HCC) 11/02/2022   Vitamin D deficiency 06/28/2022   Primary hypertension    Environmental and seasonal allergies 12/20/2020   Chronic pain of right knee 12/20/2020   Obesity (BMI 30-39.9) 06/22/2020   Hypothyroidism 12/03/2016    Past Surgical History:  Procedure Laterality Date   ARTHROSCOPIC REPAIR ACL  10/15/1984   right knee   KNEE ARTHROPLASTY     MASS EXCISION Right 09/30/2014   Procedure: 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;  Surgeon: Cindee Salt, MD;  Location: [facility];  Service: Orthopedics;  Laterality: Right;   THYROIDECTOMY  10/15/2004   TILT TABLE STUDY  10/15/2005    OB History   No obstetric history on file.      Home Medications    Prior to Admission medications   Medication Sig Start Date End Date  Taking? Authorizing Provider  predniSONE (DELTASONE) 20 MG tablet Take 2 tablets (40 mg total) by mouth daily with breakfast. 07/01/23  Yes Particia Nearing, PA-C  tiZANidine (ZANAFLEX) 4 MG capsule Take 1 capsule (4 mg total) by mouth 3 (three) times daily as needed for muscle spasms. Do not drink alcohol or drive while taking this medication.  May cause drowsiness. 07/01/23  Yes Particia Nearing, PA-C  Cholecalciferol (VITAMIN D3) 125 MCG (5000 UT) CAPS Take 5,000 Units by mouth daily.    [provider]  levothyroxine (SYNTHROID) 100 MCG tablet Take 1 tablet (100 mcg total) by mouth daily before breakfast. 11/05/22   Gabriel Earing, FNP  lisinopril (ZESTRIL) 5 MG tablet Take 1 tablet (5 mg total) by mouth daily. 11/05/22   Gabriel Earing, FNP    Family History Family History  Problem Relation Age of Onset   Heart disease Mother        stents 71   Asthma Mother    Hearing loss Mother    Hypertension Mother    Heart disease Father    Alzheimer's disease Father    Alcohol abuse Father    Leukemia Father    Heart disease Maternal Grandmother    Multiple myeloma Paternal Aunt     Social History Social History   Tobacco Use   Smoking status: Former  Current packs/day: 0.00    Average packs/day: 0.3 packs/day for 30.4 years (7.6 ttl pk-yrs)    Types: Cigarettes    Start date: 10/15/1990    Quit date: 03/26/2021    Years since quitting: 2.2    Passive exposure: Current   Smokeless tobacco: Never  Vaping Use   Vaping status: Never Used  Substance Use Topics   Alcohol use: No    Comment: very rare   Drug use: No     Allergies   Other, Penicillins, Latex, and Tape   Review of Systems Review of Systems PER HPI  Physical Exam Triage Vital Signs ED Triage Vitals  Encounter Vitals Group     BP 07/01/23 1626 132/83     Systolic BP Percentile --      Diastolic BP Percentile --      Pulse Rate 07/01/23 1626 (!) 111     Resp 07/01/23 1626 20      Temp 07/01/23 1626 98.5 F (36.9 C)     Temp Source 07/01/23 1626 Oral     SpO2 07/01/23 1626 97 %     Weight --      Height --      Head Circumference --      Peak Flow --      Pain Score 07/01/23 1627 5     Pain Loc --      Pain Education --      Exclude from Growth Chart --    No data found.  Updated Vital Signs BP 132/83 (BP Location: Right Arm)   Pulse (!) 111   Temp 98.5 F (36.9 C) (Oral)   Resp 20   LMP 09/26/2014   SpO2 97%   Visual Acuity Right Eye Distance:   Left Eye Distance:   Bilateral Distance:    Right Eye Near:   Left Eye Near:    Bilateral Near:     Physical Exam Vitals and nursing note reviewed.  Constitutional:      Appearance: Normal appearance. She is not ill-appearing.  HENT:     Head: Atraumatic.  Eyes:     Extraocular Movements: Extraocular movements intact.     Conjunctiva/sclera: Conjunctivae normal.  Cardiovascular:     Rate and Rhythm: Normal rate and regular rhythm.     Heart sounds: Normal heart sounds.  Pulmonary:     Effort: Pulmonary effort is normal.     Breath sounds: Normal breath sounds.  Musculoskeletal:        General: Tenderness present. No swelling or deformity. Normal range of motion.     Cervical back: Normal range of motion and neck supple.     Comments: Tenderness to palpation to left periscapular region, trapezius, and to deltoid.  Grip strength full and equal bilateral hands.  Range of motion intact  Skin:    General: Skin is warm and dry.     Findings: No bruising or erythema.  Neurological:     Mental Status: She is alert and oriented to person, place, and time.     Motor: No weakness.     Gait: Gait normal.     Comments: Left upper extremity neurovascularly intact  Psychiatric:        Mood and Affect: Mood normal.        Thought Content: Thought content normal.        Judgment: Judgment normal.      UC Treatments / Results  Labs (all labs ordered are listed, but only abnormal results  are  displayed) Labs Reviewed - No data to display  EKG   Radiology No results found.  Procedures Procedures (including critical care time)  Medications Ordered in UC Medications - No data to display  Initial Impression / Assessment and Plan / UC Course  I have reviewed the triage vital signs and the nursing notes.  Pertinent labs & imaging results that were available during my care of the patient were reviewed by me and considered in my medical decision making (see chart for details).     Vitals and exam reassuring, suspect muscular strain.  Treat with Zanaflex, prednisone, supportive over-the-counter medications and home care.  Return for worsening symptoms.  Final Clinical Impressions(s) / UC Diagnoses   Final diagnoses:  Strain of left shoulder, initial encounter   Discharge Instructions   None    ED Prescriptions     Medication Sig Dispense Auth. Provider   tiZANidine (ZANAFLEX) 4 MG capsule Take 1 capsule (4 mg total) by mouth 3 (three) times daily as needed for muscle spasms. Do not drink alcohol or drive while taking this medication.  May cause drowsiness. 15 capsule Particia Nearing, [place]   predniSONE (DELTASONE) 20 MG tablet Take 2 tablets (40 mg total) by mouth daily with breakfast. 10 tablet Particia Nearing, [place]      PDMP not reviewed this encounter.   Particia Nearing, [place] 07/01/23 1710

## 2023-07-01 NOTE — ED Triage Notes (Signed)
Pt reports left shoulder pain that radiates down to her elbow after holding onto a shelf at the store x 1 day. Whole arm is painful fingers included and numb

## 2023-07-27 ENCOUNTER — Ambulatory Visit
Admission: EM | Admit: 2023-07-27 | Discharge: 2023-07-27 | Disposition: A | Discharge status: Home / Self Care | Payer: 59 | Attending: Nurse Practitioner | Admitting: Nurse Practitioner

## 2023-07-27 ENCOUNTER — Encounter: Payer: Self-pay | Admitting: Emergency Medicine

## 2023-07-27 DIAGNOSIS — J069 Acute upper respiratory infection, unspecified: Secondary | ICD-10-CM | POA: Diagnosis not present

## 2023-07-27 MED ORDER — CETIRIZINE HCL 10 MG PO TABS: 10.0000 mg | ORAL_TABLET | Freq: Every day | ORAL | 0 refills | Status: DC

## 2023-07-27 MED ORDER — PSEUDOEPH-BROMPHEN-DM 30-2-10 MG/5ML PO SYRP: 5.0000 mL | ORAL_SOLUTION | Freq: Four times a day (QID) | ORAL | 0 refills | Status: DC | PRN

## 2023-07-27 MED ORDER — FLUTICASONE PROPIONATE 50 MCG/ACT NA SUSP: 2.0000 | Freq: Every day | NASAL | 0 refills | Status: DC

## 2023-07-27 NOTE — Discharge Instructions (Signed)
Take medication as prescribed. Increase fluids and allow for plenty of rest. May take over-the-counter Tylenol or ibuprofen as needed for pain, fever, or general discomfort. Warm salt water gargles 3-4 times daily as needed for throat pain or discomfort. Recommend normal saline nasal spray throughout the day for nasal congestion and runny nose. For your cough, it may be helpful to sleep elevated on pillows, and to use a humidifier in your bedroom at nighttime during sleep. If symptoms have not improved over the next 7 to 10 days, or suddenly worsening before that time, you may follow-up in this clinic or with your primary care physician for further evaluation. Follow-up as needed.

## 2023-07-27 NOTE — ED Provider Notes (Signed)
[facility]    CSN: [id number] Arrival date & time: 07/27/23  0906      History   Chief Complaint No chief complaint on file.   HPI [name] is a 57 y.o. female.   The history is provided by the patient.   Patient presents with a 2-day history of headache, nasal congestion, scratchy throat, and productive cough.  Patient denies fever, chills, ear pain, ear drainage, wheezing, shortness of breath, difficulty breathing, abdominal pain, nausea, vomiting, or diarrhea.  Patient reports several of her coworkers have been sick with the similar symptoms.  Patient reports she has been taking NyQuil.  Patient declines COVID testing.  Past Medical History:  Diagnosis Date   Allergy    multiple   Arthritis    back and shoulders   Chest pain    History of first degree AV block 06/22/2020   History of vitamin D deficiency 06/22/2020   HTN (hypertension) 06/07/2021   Hyperthyroidism    HYPOTENSION, ORTHOSTATIC 10/29/2008   POTS    Pedal edema 12/03/2016   Post-menopausal 12/03/2016   POTS (postural orthostatic tachycardia syndrome)    Tobacco abuse 12/03/2016    Patient Active Problem List   Diagnosis Date Noted   Urinary frequency 11/02/2022   Morbid obesity (HCC) 11/02/2022   Vitamin D deficiency 06/28/2022   Primary hypertension    Environmental and seasonal allergies 12/20/2020   Chronic pain of right knee 12/20/2020   Obesity (BMI 30-39.9) 06/22/2020   Hypothyroidism 12/03/2016    Past Surgical History:  Procedure Laterality Date   ARTHROSCOPIC REPAIR ACL  10/15/1984   right knee   KNEE ARTHROPLASTY     MASS EXCISION Right 09/30/2014   Procedure: 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;  Surgeon: Cindee Salt, MD;  Location: [facility];  Service: Orthopedics;  Laterality: Right;   THYROIDECTOMY  10/15/2004   TILT TABLE STUDY  10/15/2005    OB History   No obstetric history on file.      Home  Medications    Prior to Admission medications   Medication Sig Start Date End Date Taking? Authorizing Provider  brompheniramine-pseudoephedrine-DM 30-2-10 MG/5ML syrup Take 5 mLs by mouth 4 (four) times daily as needed. 07/27/23  Yes Ryenne Lynam-Warren, Sadie Haber, NP  cetirizine (ZYRTEC) 10 MG tablet Take 1 tablet (10 mg total) by mouth daily. 07/27/23  Yes Anatalia Kronk-Warren, Sadie Haber, NP  fluticasone (FLONASE) 50 MCG/ACT nasal spray Place 2 sprays into both nostrils daily. 07/27/23  Yes Sanjuanita Condrey-Warren, Sadie Haber, NP  Cholecalciferol (VITAMIN D3) 125 MCG (5000 UT) CAPS Take 5,000 Units by mouth daily.    [provider]  levothyroxine (SYNTHROID) 100 MCG tablet Take 1 tablet (100 mcg total) by mouth daily before breakfast. 11/05/22   Gabriel Earing, FNP  lisinopril (ZESTRIL) 5 MG tablet Take 1 tablet (5 mg total) by mouth daily. 11/05/22   Gabriel Earing, FNP    Family History Family History  Problem Relation Age of Onset   Heart disease Mother        stents 1   Asthma Mother    Hearing loss Mother    Hypertension Mother    Heart disease Father    Alzheimer's disease Father    Alcohol abuse Father    Leukemia Father    Heart disease Maternal Grandmother    Multiple myeloma Paternal Aunt     Social History Social History   Tobacco Use   Smoking status: Former  Current packs/day: 0.00    Average packs/day: 0.3 packs/day for 30.4 years (7.6 ttl pk-yrs)    Types: Cigarettes    Start date: 10/15/1990    Quit date: 03/26/2021    Years since quitting: 2.3    Passive exposure: Current   Smokeless tobacco: Never  Vaping Use   Vaping status: Never Used  Substance Use Topics   Alcohol use: No    Comment: very rare   Drug use: No     Allergies   Other, Penicillins, Latex, and Tape   Review of Systems Review of Systems Per HPI  Physical Exam Triage Vital Signs ED Triage Vitals  Encounter Vitals Group     BP 07/27/23 0910 135/85     Systolic BP Percentile --       Diastolic BP Percentile --      Pulse Rate 07/27/23 0910 90     Resp 07/27/23 0910 18     Temp 07/27/23 0910 98.2 F (36.8 C)     Temp Source 07/27/23 0910 Oral     SpO2 07/27/23 0910 97 %     Weight --      Height --      Head Circumference --      Peak Flow --      Pain Score 07/27/23 0911 1     Pain Loc --      Pain Education --      Exclude from Growth Chart --    No data found.  Updated Vital Signs BP 135/85 (BP Location: Right Arm)   Pulse 90   Temp 98.2 F (36.8 C) (Oral)   Resp 18   LMP 09/26/2014   SpO2 97%   Visual Acuity Right Eye Distance:   Left Eye Distance:   Bilateral Distance:    Right Eye Near:   Left Eye Near:    Bilateral Near:     Physical Exam Vitals and nursing note reviewed.  Constitutional:      General: She is not in acute distress.    Appearance: Normal appearance.  HENT:     Head: Normocephalic.     Right Ear: Tympanic membrane, ear canal and external ear normal.     Left Ear: Tympanic membrane, ear canal and external ear normal.     Nose: Congestion present.     Right Turbinates: Enlarged and swollen.     Left Turbinates: Enlarged and swollen.     Right Sinus: No maxillary sinus tenderness or frontal sinus tenderness.     Left Sinus: No maxillary sinus tenderness or frontal sinus tenderness.     Mouth/Throat:     Lips: Pink.     Mouth: Mucous membranes are moist.     Pharynx: Uvula midline. Posterior oropharyngeal erythema and postnasal drip present. No pharyngeal swelling, oropharyngeal exudate or uvula swelling.     Tonsils: 0 on the right. 0 on the left.     Comments: Cobblestoning present to posterior oropharynx  Eyes:     Extraocular Movements: Extraocular movements intact.     Conjunctiva/sclera: Conjunctivae normal.     Pupils: Pupils are equal, round, and reactive to light.  Cardiovascular:     Rate and Rhythm: Normal rate and regular rhythm.     Pulses: Normal pulses.     Heart sounds: Normal heart sounds.   Pulmonary:     Effort: Pulmonary effort is normal. No respiratory distress.     Breath sounds: Normal breath sounds. No stridor. No wheezing, rhonchi or  rales.  Abdominal:     General: Bowel sounds are normal.     Palpations: Abdomen is soft.     Tenderness: There is no abdominal tenderness.  Musculoskeletal:     Cervical back: Normal range of motion.  Lymphadenopathy:     Cervical: No cervical adenopathy.  Skin:    General: Skin is warm and dry.  Neurological:     General: No focal deficit present.     Mental Status: She is alert and oriented to person, place, and time.  Psychiatric:        Mood and Affect: Mood normal.        Behavior: Behavior normal.      UC Treatments / Results  Labs (all labs ordered are listed, but only abnormal results are displayed) Labs Reviewed - No data to display  EKG   Radiology No results found.  Procedures Procedures (including critical care time)  Medications Ordered in UC Medications - No data to display  Initial Impression / Assessment and Plan / UC Course  I have reviewed the triage vital signs and the nursing notes.  Pertinent labs & imaging results that were available during my care of the patient were reviewed by me and considered in my medical decision making (see chart for details).  Patient is well-appearing, she is in no acute distress, vital signs are stable.  Patient declines COVID testing; however, do suspect this is a viral upper respiratory infection with cough.  Symptomatic treatment was provided with Bromfed-DM for the cough, fluticasone 50 mcg nasal spray for nasal congestion and runny nose, and cetirizine 10 mg for nasal congestion and drainage.  Supportive care recommendations were provided and discussed with the patient to include increasing fluids, allowing for plenty of rest, over-the-counter analgesics for pain or discomfort, normal saline nasal spray, and a humidifier in the bedroom at nighttime during sleep.   Patient was given indications of when follow-up be necessary.  Patient is in agreement with this plan of care and verbalizes understanding.  All questions were answered.  Patient stable for discharge.  Final Clinical Impressions(s) / UC Diagnoses   Final diagnoses:  Viral upper respiratory tract infection with cough     Discharge Instructions      Take medication as prescribed. Increase fluids and allow for plenty of rest. May take over-the-counter Tylenol or ibuprofen as needed for pain, fever, or general discomfort. Warm salt water gargles 3-4 times daily as needed for throat pain or discomfort. Recommend normal saline nasal spray throughout the day for nasal congestion and runny nose. For your cough, it may be helpful to sleep elevated on pillows, and to use a humidifier in your bedroom at nighttime during sleep. If symptoms have not improved over the next 7 to 10 days, or suddenly worsening before that time, you may follow-up in this clinic or with your primary care physician for further evaluation. Follow-up as needed.     ED Prescriptions     Medication Sig Dispense Auth. Provider   brompheniramine-pseudoephedrine-DM 30-2-10 MG/5ML syrup Take 5 mLs by mouth 4 (four) times daily as needed. 140 mL Adriyanna Christians-Warren, Sadie Haber, NP   cetirizine (ZYRTEC) 10 MG tablet Take 1 tablet (10 mg total) by mouth daily. 30 tablet Phill Steck-Warren, Sadie Haber, NP   fluticasone (FLONASE) 50 MCG/ACT nasal spray Place 2 sprays into both nostrils daily. 16 g Na Waldrip-Warren, Sadie Haber, NP      PDMP not reviewed this encounter.   Abran Cantor, NP 07/27/23 [phone]

## 2023-07-27 NOTE — ED Triage Notes (Signed)
Sore throat (scratchy), nasal congestion, headache, productive cough since Friday.  Has been taking nyquil.

## 2023-12-16 ENCOUNTER — Other Ambulatory Visit: Payer: Self-pay | Admitting: Family Medicine

## 2023-12-16 DIAGNOSIS — I10 Essential (primary) hypertension: Secondary | ICD-10-CM

## 2023-12-16 DIAGNOSIS — E890 Postprocedural hypothyroidism: Secondary | ICD-10-CM

## 2023-12-27 ENCOUNTER — Ambulatory Visit (INDEPENDENT_AMBULATORY_CARE_PROVIDER_SITE_OTHER): Payer: 59 | Admitting: Physician Assistant

## 2023-12-27 ENCOUNTER — Encounter: Payer: Self-pay | Admitting: Physician Assistant

## 2023-12-27 VITALS — BP 129/75 | Ht 65.0 in | Wt 217.8 lb

## 2023-12-27 DIAGNOSIS — E890 Postprocedural hypothyroidism: Secondary | ICD-10-CM | POA: Diagnosis not present

## 2023-12-27 DIAGNOSIS — F419 Anxiety disorder, unspecified: Secondary | ICD-10-CM | POA: Diagnosis not present

## 2023-12-27 DIAGNOSIS — Z7689 Persons encountering health services in other specified circumstances: Secondary | ICD-10-CM

## 2023-12-27 DIAGNOSIS — I10 Essential (primary) hypertension: Secondary | ICD-10-CM | POA: Diagnosis not present

## 2023-12-27 DIAGNOSIS — Z6836 Body mass index (BMI) 36.0-36.9, adult: Secondary | ICD-10-CM

## 2023-12-27 DIAGNOSIS — E669 Obesity, unspecified: Secondary | ICD-10-CM

## 2023-12-27 DIAGNOSIS — F32A Depression, unspecified: Secondary | ICD-10-CM

## 2023-12-27 DIAGNOSIS — Z716 Tobacco abuse counseling: Secondary | ICD-10-CM

## 2023-12-27 DIAGNOSIS — Z0000 Encounter for general adult medical examination without abnormal findings: Secondary | ICD-10-CM | POA: Insufficient documentation

## 2023-12-27 HISTORY — DX: Depression, unspecified: F32.A

## 2023-12-27 HISTORY — DX: Encounter for general adult medical examination without abnormal findings: Z00.00

## 2023-12-27 MED ORDER — BUPROPION HCL ER (SR) 150 MG PO TB12: 150.0000 mg | ORAL_TABLET | Freq: Two times a day (BID) | ORAL | 3 refills | Status: DC

## 2023-12-27 NOTE — Progress Notes (Signed)
 New Patient Office Visit  Subjective    Patient ID: [name], female    DOB: 25-Jan-1966  Age: 58 y.o. MRN: [id number]  CC:  Chief Complaint  Patient presents with   Establish Care    HPI [name] presents to establish care  Patient with past medical history significant for hypertension and post operative hypothyroidism. She reports daily compliance with medications. Patient relates significant amount of stress as she is primary caregiver for her mother and multiple other family members, as well as working full time. She reports no health concerns today. Patient relates she is not interested in preventative health maintenance as of now due to other priorities in life. Patient does state she had previously stopped smoking but recently with the increased stress in her life has been smoking a few cigarettes some evenings. She has no other concerns.   Outpatient Encounter Medications as of 12/27/2023  Medication Sig   buPROPion (WELLBUTRIN SR) 150 MG 12 hr tablet Take 1 tablet (150 mg total) by mouth 2 (two) times daily.   Cholecalciferol (VITAMIN D3) 125 MCG (5000 UT) CAPS Take 5,000 Units by mouth daily.   levothyroxine (SYNTHROID) 100 MCG tablet Take 1 tablet (100 mcg total) by mouth daily before breakfast.   lisinopril (ZESTRIL) 5 MG tablet Take 1 tablet (5 mg total) by mouth daily.   [DISCONTINUED] brompheniramine-pseudoephedrine-DM 30-2-10 MG/5ML syrup Take 5 mLs by mouth 4 (four) times daily as needed.   [DISCONTINUED] cetirizine (ZYRTEC) 10 MG tablet Take 1 tablet (10 mg total) by mouth daily.   [DISCONTINUED] fluticasone (FLONASE) 50 MCG/ACT nasal spray Place 2 sprays into both nostrils daily.   No facility-administered encounter medications on file as of 12/27/2023.    Past Medical History:  Diagnosis Date   Allergy    multiple   Anxiety and depression 12/27/2023   Arthritis    back and shoulders   Chest pain    History of first degree AV block 06/22/2020   History of  vitamin D deficiency 06/22/2020   HTN (hypertension) 06/07/2021   Hyperthyroidism    HYPOTENSION, ORTHOSTATIC 10/29/2008   POTS    Pedal edema 12/03/2016   Post-menopausal 12/03/2016   POTS (postural orthostatic tachycardia syndrome)    Routine adult health maintenance 12/27/2023   Tobacco abuse 12/03/2016    Past Surgical History:  Procedure Laterality Date   ARTHROSCOPIC REPAIR ACL  10/15/1984   right knee   KNEE ARTHROPLASTY     MASS EXCISION Right 09/30/2014   Procedure: 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;  Surgeon: Cindee Salt, MD;  Location: [facility];  Service: Orthopedics;  Laterality: Right;   THYROIDECTOMY  10/15/2004   TILT TABLE STUDY  10/15/2005    Family History  Problem Relation Age of Onset   Heart disease Mother        stents 74   Asthma Mother    Hearing loss Mother    Hypertension Mother    Heart disease Father    Alzheimer's disease Father    Alcohol abuse Father    Leukemia Father    Heart disease Maternal Grandmother    Multiple myeloma Paternal Aunt     Social History   Socioeconomic History   Marital status: Married    Spouse name: Not on file   Number of children: 0   Years of education: 13   Highest education level: Some college, no degree  Occupational History   Occupation: Automotive engineer  Comment: [employer]   Tobacco Use   Smoking status: Some Days    Current packs/day: 0.25    Average packs/day: 0.3 packs/day for 31.6 years (7.9 ttl pk-yrs)    Types: Cigarettes    Start date: 10/15/1990    Last attempt to quit: 03/26/2021    Passive exposure: Current   Smokeless tobacco: Never  Vaping Use   Vaping status: Never Used  Substance and Sexual Activity   Alcohol use: No    Comment: very rare   Drug use: No   Sexual activity: Yes  Other Topics Concern   Not on file  Social History Narrative   Divorced   Lives ex husband-relationship is good friends   [place]   [place]      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   Social Drivers of Corporate investment banker Strain: Low Risk  (12/20/2023)   Overall Financial Resource Strain (CARDIA)    Difficulty of Paying Living Expenses: Not hard at all  Food Insecurity: No Food Insecurity (12/20/2023)   Hunger Vital Sign    Worried About Running Out of Food in the Last Year: Never true    Ran Out of Food in the Last Year: Never true  Transportation Needs: No Transportation Needs (12/20/2023)   PRAPARE - Administrator, Civil Service (Medical): No    Lack of Transportation (Non-Medical): No  Physical Activity: Insufficiently Active (12/20/2023)   Exercise Vital Sign    Days of Exercise per Week: 2 days    Minutes of Exercise per Session: 30 min  Stress: No Stress Concern Present (12/20/2023)   [employer] of Occupational Health - Occupational Stress Questionnaire    Feeling of Stress : Only a little  Social Connections: Socially Integrated (12/20/2023)   Social Connection and Isolation Panel [NHANES]    Frequency of Communication with Friends and Family: More than three times a week    Frequency of Social Gatherings with Friends and Family: Once a week    Attends Religious Services: More than 4 times per year    Active Member of [employer] or Organizations: Yes    Attends Engineer, structural: More than 4 times per year    Marital Status: Married  Catering manager Violence: Not At Risk (11/25/2018)   Humiliation, Afraid, Rape, and Kick questionnaire    Fear of Current or Ex-Partner: No    Emotionally Abused: No    Physically Abused: No    Sexually Abused: No    Review of Systems  Constitutional:  Negative for fever, malaise/fatigue and weight loss.  Eyes:  Negative for blurred vision and double vision.  Respiratory:  Negative for shortness of breath.   Cardiovascular:  Negative for chest pain.  Gastrointestinal:  Negative for nausea and  vomiting.  Neurological:  Negative for headaches.  Psychiatric/Behavioral:  Positive for depression. The patient is nervous/anxious.         Objective    BP 129/75   Ht 5\' 5"  (1.651 m)   Wt 217 lb 12.8 oz (98.8 kg)   LMP 09/26/2014   BMI 36.24 kg/m   Physical Exam Constitutional:      Appearance: Normal appearance.  HENT:     Head: Normocephalic.     Mouth/Throat:     Mouth: Mucous membranes are moist.     Pharynx: Oropharynx is clear.  Eyes:     Extraocular Movements: Extraocular movements intact.  Conjunctiva/sclera: Conjunctivae normal.  Cardiovascular:     Rate and Rhythm: Normal rate and regular rhythm.     Heart sounds: Normal heart sounds. No murmur heard. Pulmonary:     Effort: Pulmonary effort is normal.     Breath sounds: Normal breath sounds.  Musculoskeletal:     Right lower leg: No edema.     Left lower leg: No edema.  Skin:    General: Skin is warm and dry.  Neurological:     General: No focal deficit present.     Mental Status: She is alert and oriented to person, place, and time.  Psychiatric:        Mood and Affect: Mood normal.        Behavior: Behavior normal.         Assessment & Plan:  Encounter to establish care  Primary hypertension Assessment & Plan: 129/75 Controlled. Continue current medications. No change in management. Discussed DASH diet and dietary sodium restrictions.  Increase dietary efforts and physical activity.  Declines CMP, CBC, and lipid panel today.    Postoperative hypothyroidism Assessment & Plan: Stable, patient with no complaints today. Will check TSH/T4 today and adjust medication as indicated.   Orders: -     TSH + free T4  Routine adult health maintenance Assessment & Plan: Patient prefers to forego preventative care at this time. She was counseled on recommended screenings and states she would be open to screenings in the future, but not today.    Anxiety and depression Assessment &  Plan: Patient reports anxiety and some depression recently as she has been primary caregiver for many family members. She denies thoughts or harming herself. We will start Wellbutrin today. Patient counseled on side effects and advised to stop taking medication if she experiences suicidal ideation. Patient agreeable to plan.  Orders: -     buPROPion HCl ER (SR); Take 1 tablet (150 mg total) by mouth 2 (two) times daily.  Dispense: 180 tablet; Refill: 3  Encounter for smoking cessation counseling Assessment & Plan: We discussed risks related to smoking. Patient voices understanding. She states she had previously quit and would like to again. We discussed potential benefits with Wellbutrin. Patient agreeable to plan.   Time spent counseling- approximately 10 minutes.   Orders: -     buPROPion HCl ER (SR); Take 1 tablet (150 mg total) by mouth 2 (two) times daily.  Dispense: 180 tablet; Refill: 3  Obesity (BMI 30-39.9) Assessment & Plan: Discussed healthy diet and exercise.      Return in about 6 months (around 06/28/2024).   Toni Amend [place], PA-C

## 2023-12-27 NOTE — Assessment & Plan Note (Signed)
 Stable, patient with no complaints today. Will check TSH/T4 today and adjust medication as indicated.

## 2023-12-27 NOTE — Assessment & Plan Note (Signed)
 We discussed risks related to smoking. Patient voices understanding. She states she had previously quit and would like to again. We discussed potential benefits with Wellbutrin. Patient agreeable to plan.   Time spent counseling- approximately 10 minutes.

## 2023-12-27 NOTE — Assessment & Plan Note (Addendum)
 129/75 Controlled. Continue current medications. No change in management. Discussed DASH diet and dietary sodium restrictions.  Increase dietary efforts and physical activity.  Declines CMP, CBC, and lipid panel today.

## 2023-12-27 NOTE — Assessment & Plan Note (Signed)
 Discussed healthy diet and exercise

## 2023-12-27 NOTE — Assessment & Plan Note (Signed)
 Patient reports anxiety and some depression recently as she has been primary caregiver for many family members. She denies thoughts or harming herself. We will start Wellbutrin today. Patient counseled on side effects and advised to stop taking medication if she experiences suicidal ideation. Patient agreeable to plan.

## 2023-12-27 NOTE — Assessment & Plan Note (Signed)
 Patient prefers to forego preventative care at this time. She was counseled on recommended screenings and states she would be open to screenings in the future, but not today.

## 2023-12-28 LAB — TSH+FREE T4
Free T4: 1.16 ng/dL (ref 0.82–1.77)
TSH: 5.73 u[IU]/mL — ABNORMAL HIGH (ref 0.450–4.500)

## 2023-12-30 ENCOUNTER — Other Ambulatory Visit: Payer: Self-pay | Admitting: Physician Assistant

## 2023-12-30 ENCOUNTER — Encounter: Payer: Self-pay | Admitting: Physician Assistant

## 2023-12-30 DIAGNOSIS — E890 Postprocedural hypothyroidism: Secondary | ICD-10-CM

## 2023-12-30 MED ORDER — LEVOTHYROXINE SODIUM 112 MCG PO TABS: 112.0000 ug | ORAL_TABLET | Freq: Every day | ORAL | 3 refills | Status: AC

## 2024-02-27 ENCOUNTER — Emergency Department (HOSPITAL_COMMUNITY)
Admission: EM | Admit: 2024-02-27 | Discharge: 2024-02-27 | Disposition: A | Discharge status: Home / Self Care | Attending: Emergency Medicine | Admitting: Emergency Medicine

## 2024-02-27 ENCOUNTER — Other Ambulatory Visit: Payer: Self-pay

## 2024-02-27 ENCOUNTER — Emergency Department (HOSPITAL_COMMUNITY)

## 2024-02-27 ENCOUNTER — Encounter (HOSPITAL_COMMUNITY): Payer: Self-pay

## 2024-02-27 DIAGNOSIS — Z91040 Latex allergy status: Secondary | ICD-10-CM | POA: Diagnosis not present

## 2024-02-27 DIAGNOSIS — I10 Essential (primary) hypertension: Secondary | ICD-10-CM | POA: Diagnosis not present

## 2024-02-27 DIAGNOSIS — R0789 Other chest pain: Secondary | ICD-10-CM | POA: Diagnosis present

## 2024-02-27 DIAGNOSIS — I209 Angina pectoris, unspecified: Secondary | ICD-10-CM | POA: Diagnosis not present

## 2024-02-27 DIAGNOSIS — Z79899 Other long term (current) drug therapy: Secondary | ICD-10-CM | POA: Insufficient documentation

## 2024-02-27 LAB — CBC WITH DIFFERENTIAL/PLATELET
Abs Immature Granulocytes: 0.03 10*3/uL (ref 0.00–0.07)
Basophils Absolute: 0.1 10*3/uL (ref 0.0–0.1)
Basophils Relative: 1 %
Eosinophils Absolute: 0.4 10*3/uL (ref 0.0–0.5)
Eosinophils Relative: 4 %
HCT: 44.6 % (ref 36.0–46.0)
Hemoglobin: 14.8 g/dL (ref 12.0–15.0)
Immature Granulocytes: 0 %
Lymphocytes Relative: 25 %
Lymphs Abs: 2.5 10*3/uL (ref 0.7–4.0)
MCH: 29.8 pg (ref 26.0–34.0)
MCHC: 33.2 g/dL (ref 30.0–36.0)
MCV: 89.7 fL (ref 80.0–100.0)
Monocytes Absolute: 0.6 10*3/uL (ref 0.1–1.0)
Monocytes Relative: 6 %
Neutro Abs: 6.5 10*3/uL (ref 1.7–7.7)
Neutrophils Relative %: 64 %
Platelets: 281 10*3/uL (ref 150–400)
RBC: 4.97 MIL/uL (ref 3.87–5.11)
RDW: 11.9 % (ref 11.5–15.5)
WBC: 10 10*3/uL (ref 4.0–10.5)
nRBC: 0 % (ref 0.0–0.2)

## 2024-02-27 LAB — COMPREHENSIVE METABOLIC PANEL WITH GFR
ALT: 30 U/L (ref 0–44)
AST: 25 U/L (ref 15–41)
Albumin: 4.1 g/dL (ref 3.5–5.0)
Alkaline Phosphatase: 94 U/L (ref 38–126)
Anion gap: 11 (ref 5–15)
BUN: 15 mg/dL (ref 6–20)
CO2: 23 mmol/L (ref 22–32)
Calcium: 8.9 mg/dL (ref 8.9–10.3)
Chloride: 102 mmol/L (ref 98–111)
Creatinine, Ser: 0.91 mg/dL (ref 0.44–1.00)
GFR, Estimated: >60 mL/min (ref >60)
Glucose, Bld: 109 mg/dL — ABNORMAL HIGH (ref 70–99)
Potassium: 3.9 mmol/L (ref 3.5–5.1)
Sodium: 136 mmol/L (ref 135–145)
Total Bilirubin: 1 mg/dL (ref 0.0–1.2)
Total Protein: 7.6 g/dL (ref 6.5–8.1)

## 2024-02-27 LAB — TROPONIN I (HIGH SENSITIVITY)
Troponin I (High Sensitivity): <2 ng/L (ref <18)
Troponin I (High Sensitivity): <2 ng/L (ref <18)

## 2024-02-27 MED ORDER — NITROGLYCERIN 0.4 MG SL SUBL
0.4000 mg | SUBLINGUAL_TABLET | SUBLINGUAL | Status: DC | PRN
  Administered 2024-02-27: 0.4 mg via SUBLINGUAL
  Filled 2024-02-27: qty 1

## 2024-02-27 MED ORDER — ASPIRIN 81 MG PO CHEW
162.0000 mg | CHEWABLE_TABLET | Freq: Once | ORAL | Status: AC
  Administered 2024-02-27: 162 mg via ORAL
  Filled 2024-02-27: qty 2

## 2024-02-27 NOTE — Discharge Instructions (Signed)
 We saw you in the ER for the chest pain/shortness of breath. All of our cardiac workup is normal, including labs, EKG and chest X-RAY are normal. We are not sure what is causing your discomfort, but we feel comfortable sending you home at this time. The workup in the ER is not complete, and you should follow up with your primary care doctor for further evaluation.  Please return to the ER if you have worsening chest pain, shortness of breath, pain radiating to your jaw, shoulder, or back, sweats or fainting. Otherwise see the Cardiologist (referral has been placed).

## 2024-02-27 NOTE — ED Triage Notes (Signed)
 Pt arrived via POV c/o chest pain and left arm pain X 2 days. Pt endorses family Hx of heart problems, and reports feeling lethargic, SOB.

## 2024-02-27 NOTE — ED Provider Notes (Signed)
 [place] EMERGENCY DEPARTMENT AT [facility] Provider Note   CSN: [id number] Arrival date & time: 02/27/24  1228     History  Chief Complaint  Patient presents with   Chest Pain    [name] is a 58 y.o. female.  HPI    58 year old female comes in with chief complaint of chest pain. Patient has history of hypertension, elevated BMI.  She has family history of heart attack.  Patient states that she has been having some chest discomfort off and on for the last 2 days.  She describes the discomfort as squeezing type feeling on the left side, it is nonradiating, its intermittent.  She has also noted exertional shortness of breath.  There is no specific evoking, aggravating or relieving factor with the chest squeezing.  Patient is also felt nauseous sometimes.  No GERD.  No cough, fevers, chills.  Pt has no hx of PE, DVT and denies any exogenous hormone (testosterone / estrogen) use, long distance travels or surgery in the past 6 weeks, active cancer, recent immobilization.   Home Medications Prior to Admission medications   Medication Sig Start Date End Date Taking? Authorizing Provider  buPROPion  (WELLBUTRIN  SR) 150 MG 12 hr tablet Take 1 tablet (150 mg total) by mouth 2 (two) times daily. 12/27/23   Grooms, Courtney, PA-C  Cholecalciferol  (VITAMIN D3) 125 MCG (5000 UT) CAPS Take 5,000 Units by mouth daily.    [provider]  levothyroxine  (SYNTHROID ) 112 MCG tablet Take 1 tablet (112 mcg total) by mouth daily. 12/30/23   Grooms, Courtney, PA-C  lisinopril  (ZESTRIL ) 5 MG tablet Take 1 tablet (5 mg total) by mouth daily. 11/05/22   Albertha Huger, FNP      Allergies    Other, Penicillins, Chinese ink, Latex, and Tape    Review of Systems   Review of Systems  All other systems reviewed and are negative.   Physical Exam Updated Vital Signs BP 129/77   Pulse 75   Temp 97.8 F (36.6 C) (Oral)   Resp 14   Ht 5\' 5"  (1.651 m)   Wt 98.8 kg   LMP  09/26/2014   SpO2 96%   BMI 36.25 kg/m  Physical Exam Vitals and nursing note reviewed.  Constitutional:      Appearance: She is well-developed.  HENT:     Head: Atraumatic.  Cardiovascular:     Rate and Rhythm: Normal rate.     Heart sounds: Normal heart sounds.  Pulmonary:     Effort: Pulmonary effort is normal.     Breath sounds: Normal breath sounds.  Musculoskeletal:     Cervical back: Normal range of motion and neck supple.     Right lower leg: No tenderness. No edema.     Left lower leg: No tenderness. No edema.  Skin:    General: Skin is warm and dry.  Neurological:     Mental Status: She is alert and oriented to person, place, and time.     ED Results / Procedures / Treatments   Labs (all labs ordered are listed, but only abnormal results are displayed) Labs Reviewed  COMPREHENSIVE METABOLIC PANEL WITH GFR - Abnormal; Notable for the following components:      Result Value   Glucose, Bld 109 (*)    All other components within normal limits  CBC WITH DIFFERENTIAL/PLATELET  TROPONIN I (HIGH SENSITIVITY)  TROPONIN I (HIGH SENSITIVITY)    EKG EKG Interpretation Date/Time:  Thursday Feb 27 2024 12:38:07  EDT Ventricular Rate:  98 PR Interval:  156 QRS Duration:  78 QT Interval:  342 QTC Calculation: 436 R Axis:   68  Text Interpretation: Normal sinus rhythm Normal ECG When compared with ECG of 11-Feb-2022 12:04, PREVIOUS ECG IS PRESENT No significant change since last tracing Confirmed by Deatra Face [phone]) on 02/27/2024 1:40:26 PM  Radiology DG Chest 2 View Result Date: 02/27/2024 CLINICAL DATA:  Chest pain. EXAM: CHEST - 2 VIEW COMPARISON:  02/11/2022. FINDINGS: The heart size and mediastinal contours are within normal limits. No focal consolidation, sizeable pleural effusion, or pneumothorax. No acute osseous abnormality. IMPRESSION: No acute cardiopulmonary findings. Electronically Signed   By: Mannie Seek M.D.   On: 02/27/2024 13:10     Procedures Procedures    Medications Ordered in ED Medications  nitroGLYCERIN  (NITROSTAT ) SL tablet 0.4 mg (0.4 mg Sublingual Given 02/27/24 1456)  aspirin  chewable tablet 162 mg (162 mg Oral Given 02/27/24 1453)    ED Course/ Medical Decision Making/ A&P              HEART Score: 3                    Medical Decision Making Amount and/or Complexity of Data Reviewed Labs: ordered. Radiology: ordered.  Risk OTC drugs. Prescription drug management.   This patient presents to the ED with chief complaint(s) of chest pain with pertinent past medical history of elevated BMI, family history of CAD, hypertension.The complaint involves an extensive differential diagnosis and also carries with it a high risk of complications and morbidity.    The differential diagnosis considered for this patient includes  ACS syndrome Aortic dissection CHF exacerbation Valvular disorder Myocarditis Pericarditis Endocarditis Pericardial effusion / tamponade Pneumonia Pleural effusion / Pulmonary edema PE Pneumothorax Musculoskeletal pain PUD / Gastritis / Esophagitis Esophageal spasm  The initial plan is to get basic labs for nonspecific chest discomfort.  Patient has reassuring looking EKG.  Delta troponin ordered. Clinically, low suspicion for PE.  Patient does not have any risk factors for dissection.  Additional history obtained: Additional history obtained from family Records reviewed previous coronary CT.  Patient had reassuring CT in May 2023: No coronary artery disease, coronary calcium score of 0   Independent labs interpretation:  The following labs were independently interpreted: Initial troponin is not detectable.  Independent visualization and interpretation of imaging: - I independently visualized the following imaging with scope of interpretation limited to determining acute life threatening conditions related to emergency care: X-ray of the chest, which revealed no  evidence of pneumothorax.  Treatment and Reassessment: Patient reassessed. Troponin injury to negative. Nitro did not particularly help.  Patient states that both her mother and sister had coronary artery disease, that was picked up on stress test.  She would very much like to get a stress test.  Last time she had coronary CT, she did not have any left arm involvement.  I informed her that we will put in a cardiology referral.  She can discuss her concerns with the cardiologist and she will need to return to the ER if her symptoms get worse prior to the appointment.  She will also start taking baby aspirin .  Final Clinical Impression(s) / ED Diagnoses Final diagnoses:  Angina pectoris (HCC)    Rx / DC Orders ED Discharge Orders          Ordered    Ambulatory referral to Cardiology       Comments: If you have not  heard from the Cardiology office within the next 72 hours please call [phone].   02/27/24 1550              Deatra Face, MD 02/27/24 1551

## 2024-03-02 ENCOUNTER — Other Ambulatory Visit: Payer: Self-pay | Admitting: Family Medicine

## 2024-03-02 DIAGNOSIS — I10 Essential (primary) hypertension: Secondary | ICD-10-CM

## 2024-03-05 ENCOUNTER — Telehealth: Payer: Self-pay

## 2024-03-05 NOTE — Telephone Encounter (Signed)
 Reason for CRM: lisinopril  (ZESTRIL ) 5 MG tablet- having an issue filling, need Jearlean Mince Grooms to send in new prescription

## 2024-03-06 ENCOUNTER — Other Ambulatory Visit: Payer: Self-pay | Admitting: Physician Assistant

## 2024-03-06 DIAGNOSIS — I10 Essential (primary) hypertension: Secondary | ICD-10-CM

## 2024-03-06 MED ORDER — LISINOPRIL 5 MG PO TABS: 5.0000 mg | ORAL_TABLET | Freq: Every day | ORAL | 3 refills | Status: AC

## 2024-03-13 ENCOUNTER — Encounter: Payer: Self-pay | Admitting: Nurse Practitioner

## 2024-03-13 ENCOUNTER — Ambulatory Visit: Attending: Nurse Practitioner | Admitting: Nurse Practitioner

## 2024-03-13 VITALS — BP 124/74 | HR 78 | Ht 65.0 in | Wt 215.0 lb

## 2024-03-13 DIAGNOSIS — R079 Chest pain, unspecified: Secondary | ICD-10-CM

## 2024-03-13 DIAGNOSIS — E669 Obesity, unspecified: Secondary | ICD-10-CM

## 2024-03-13 DIAGNOSIS — I10 Essential (primary) hypertension: Secondary | ICD-10-CM | POA: Diagnosis not present

## 2024-03-13 DIAGNOSIS — G90A Postural orthostatic tachycardia syndrome (POTS): Secondary | ICD-10-CM

## 2024-03-13 DIAGNOSIS — R0609 Other forms of dyspnea: Secondary | ICD-10-CM | POA: Diagnosis not present

## 2024-03-13 DIAGNOSIS — R0602 Shortness of breath: Secondary | ICD-10-CM

## 2024-03-13 MED ORDER — NITROGLYCERIN 0.4 MG SL SUBL: 0.4000 mg | SUBLINGUAL_TABLET | SUBLINGUAL | 2 refills | Status: AC | PRN

## 2024-03-13 NOTE — Patient Instructions (Addendum)
 Medication Instructions:  Your physician has recommended you make the following change in your medication:  The proper use and anticipated side effects of nitroglycerine has been carefully explained.  If a single episode of chest pain is not relieved by one tablet, the patient will try another within 5 minutes; and if this doesn't relieve the pain, the patient is instructed to call 911 for transportation to an emergency department.  Labwork: None   Testing/Procedures: Your physician has requested that you have a lexiscan myoview. For further information please visit [email or web address] Please follow instruction sheet, as given. Your physician has requested that you have an echocardiogram. Echocardiography is a painless test that uses sound waves to create images of your heart. It provides your doctor with information about the size and shape of your heart and how well your heart's chambers and valves are working. This procedure takes approximately one hour. There are no restrictions for this procedure. Please do NOT wear cologne, perfume, aftershave, or lotions (deodorant is allowed). Please arrive 15 minutes prior to your appointment time.  Please note: We ask at that you not bring children with you during ultrasound (echo/ vascular) testing. Due to room size and safety concerns, children are not allowed in the ultrasound rooms during exams. Our front office staff cannot provide observation of children in our lobby area while testing is being conducted. An adult accompanying a patient to their appointment will only be allowed in the ultrasound room at the discretion of the ultrasound technician under special circumstances. We apologize for any inconvenience.  Follow-Up: Your physician recommends that you schedule a follow-up appointment in: 6 weeks   Any Other Special Instructions Will Be Listed Below (If Applicable).  If you need a refill on your cardiac medications before your next  appointment, please call your pharmacy.

## 2024-03-13 NOTE — Progress Notes (Signed)
 Cardiology Office Note   Date:  03/13/2024 ID:  [name], DOB 1966/08/20, MRN [id number] PCP: Grooms, Alayne Hubert  [facility] Providers Cardiologist:  Ola Berger, MD     History of Present Illness [name] is a 58 y.o. female with a PMH of chest pain, HTN, history of POTS, hx of former tobacco abuse, and obesity, who presents today for ED follow-up.   Last seen by Dr. Avanell Bob on Feb 20, 2022.  At that time, she had been seen in the hospital for chest pain and was ruled out for MI. Symptoms were found to be atypical but concerning per Dr. Avanell Bob.  A CT coronary angiogram revealed coronary calcium score of 0 with moderate biatrial enlargement.  ED visit on Feb 27, 2024 - reported chest discomfort x 2 days, squeezing on left side, nonradiating and intermittent.  Noted exertional shortness of breath.  Intermittent, occasional nausea with symptoms.  No aggravating factors. Workup was unremarkable.   Today she presents for ED follow-up. She states she continues to note intermittent episodes of similar chest discomfort. Very concerning to her because CVD runs in her family. Also notes left sided jaw/neck pain. Denies any current chest pain, palpitations, syncope, presyncope, dizziness, orthopnea, PND, swelling or significant weight changes, acute bleeding, or claudication. Also notes DOE occasionally.   ROS: Negative. See HPI.  SH: Her mother is also a patient of mine.   Studies Reviewed  EKG: EKG Interpretation Date/Time:  Friday Mar 13 2024 09:33:05 EDT Ventricular Rate:  78 PR Interval:  208 QRS Duration:  78 QT Interval:  366 QTC Calculation: 417 R Axis:   80  Text Interpretation: Normal sinus rhythm Normal ECG When compared with ECG of 27-Feb-2024 12:38, No significant change was found Confirmed by Lasalle Pointer [phone]) on 03/13/2024 9:44:15 AM   CCTA 02/2022:  IMPRESSION: 1. No evidence of CAD, CADRADS = 0.   2. Coronary calcium score of 0.   3. Normal coronary origins  with right dominance.   4. Moderate biatrial enlargement.    Echo 07/2016:  Study Conclusions   - Left ventricle: The cavity size was normal. Wall thickness was    normal. Systolic function was normal. The estimated ejection    fraction was in the range of 55% to 60%. Wall motion was normal;    there were no regional wall motion abnormalities.   Impressions:   - Normal LV systolic function; probable mild diastolic dysfunction;    trace MR. Risk Assessment/Calculations  The 10-year ASCVD risk score (Arnett DK, et al., 2019) is: 5.6%   Values used to calculate the score:     Age: 38 years     Sex: Female     Is Non-Hispanic African American: No     Diabetic: No     Tobacco smoker: Yes     Systolic Blood Pressure: 124 mmHg     Is BP treated: Yes     HDL Cholesterol: 48 mg/dL     Total Cholesterol: 139 mg/dL   Physical Exam VS:  BP 124/74 (BP Location: Right Arm, Cuff Size: Large)   Pulse 78   Ht 5\' 5"  (1.651 m)   Wt 215 lb (97.5 kg)   LMP 09/26/2014   SpO2 99%   BMI 35.78 kg/m    Wt Readings from Last 3 Encounters:  03/13/24 215 lb (97.5 kg)  02/27/24 217 lb 13 oz (98.8 kg)  12/27/23 217 lb 12.8 oz (98.8 kg)    GEN: Obese, 58 y.o.  female in no acute distress NECK: No JVD; No carotid bruits CARDIAC: S1/S2, RRR, no murmurs, rubs, gallops RESPIRATORY:  Clear to auscultation without rales, wheezing or rhonchi  ABDOMEN: Soft, non-tender, non-distended EXTREMITIES:  No edema; No deformity   ASSESSMENT AND PLAN  Chest pain of uncertain etiology, DOE   Unclear etiology, family hx of CVD. CCTA in 2023 was negative for CAD. Recent workup unremarkable for ACS.  EKG is reassuring today.  However, did discuss NST and went over risks vs benefits. Pt requesting to do stress test and verbalizes understanding of risks vs benefits and she verbalizes understanding and is agreeable to proceed. Will also update Echo. Will Rx NTG PRN for chest pain.  Went over and educated her about  this medication and she verbalized understanding.  Care needed precautions discussed.   Informed Consent   Shared Decision Making/Informed Consent The risks [chest pain, shortness of breath, cardiac arrhythmias, dizziness, blood pressure fluctuations, myocardial infarction, stroke/transient ischemic attack, nausea, vomiting, allergic reaction, radiation exposure, metallic taste sensation and life-threatening complications (estimated to be 1 in 10,000)], benefits (risk stratification, diagnosing coronary artery disease, treatment guidance) and alternatives of a nuclear stress test were discussed in detail with [name] and she agrees to proceed.  2. Hx of POTS Denies any recent issues.  No medication changes at this time besides what is mentioned above.  Continue follow-up with PCP.  3. HTN Blood pressure stable and at goal. Discussed to monitor BP at home at least 2 hours after medications and sitting for 5-10 minutes.  Continue current medication regimen.  Continue follow-up with PCP.  Heart healthy diet recommended.  4. Obesity Weight loss via diet and exercise encouraged. Discussed the impact being overweight would have on cardiovascular risk.   Dispo: Follow-up with me/APP in 6 weeks or sooner if any changes.  Signed, Lasalle Pointer, NP

## 2024-03-16 ENCOUNTER — Ambulatory Visit: Attending: Nurse Practitioner

## 2024-03-16 DIAGNOSIS — R0602 Shortness of breath: Secondary | ICD-10-CM | POA: Diagnosis not present

## 2024-03-17 LAB — ECHOCARDIOGRAM COMPLETE
AR max vel: 2.4 cm2
AV Area VTI: 2.32 cm2
AV Area mean vel: 2.41 cm2
AV Mean grad: 5 mmHg
AV Peak grad: 9.9 mmHg
Ao pk vel: 1.57 m/s
Area-P 1/2: 4.12 cm2
Calc EF: 59.1 %
MV VTI: 3.01 cm2
S' Lateral: 2.9 cm
Single Plane A2C EF: 65.7 %
Single Plane A4C EF: 53.7 %

## 2024-03-18 ENCOUNTER — Ambulatory Visit: Payer: Self-pay | Admitting: Nurse Practitioner

## 2024-03-20 ENCOUNTER — Ambulatory Visit (HOSPITAL_COMMUNITY)
Admission: RE | Admit: 2024-03-20 | Discharge: 2024-03-20 | Disposition: A | Discharge status: Home / Self Care | Source: Ambulatory Visit | Attending: Nurse Practitioner | Admitting: Nurse Practitioner

## 2024-03-20 DIAGNOSIS — R079 Chest pain, unspecified: Secondary | ICD-10-CM | POA: Diagnosis present

## 2024-03-20 LAB — NM MYOCAR MULTI W/SPECT W/WALL MOTION / EF
Estimated workload: 1
Exercise duration (min): 0 min
Exercise duration (sec): 0 s
LV dias vol: 59 mL (ref 46–106)
LV sys vol: 16 mL
MPHR: 163 {beats}/min
Nuc Stress EF: 73 %
Peak HR: 112 {beats}/min
Percent HR: 68 %
RATE: 0.4
Rest HR: 73 {beats}/min
Rest Nuclear Isotope Dose: 11 mCi
SDS: 2
SRS: 0
SSS: 2
ST Depression (mm): 0 mm
Stress Nuclear Isotope Dose: 32 mCi
TID: 1.41

## 2024-03-20 MED ORDER — SODIUM CHLORIDE FLUSH 0.9 % IV SOLN
INTRAVENOUS | Status: AC
  Filled 2024-03-20: qty 10

## 2024-03-20 MED ORDER — TECHNETIUM TC 99M TETROFOSMIN IV KIT
11.0000 | PACK | Freq: Once | INTRAVENOUS | Status: AC | PRN
  Administered 2024-03-20: 11 via INTRAVENOUS

## 2024-03-20 MED ORDER — TECHNETIUM TC 99M TETROFOSMIN IV KIT
30.0000 | PACK | Freq: Once | INTRAVENOUS | Status: AC | PRN
  Administered 2024-03-20: 32 via INTRAVENOUS

## 2024-03-20 MED ORDER — REGADENOSON 0.4 MG/5ML IV SOLN
INTRAVENOUS | Status: AC
  Administered 2024-03-20: 0.4 mg via INTRAVENOUS
  Filled 2024-03-20: qty 5

## 2024-04-30 NOTE — Progress Notes (Signed)
 - Cardiology Office Note   Date:  05/01/2024 ID:  Cherlyn Oak, DOB 04/16/1966, MRN [id number] PCP: Grooms, Charmaine RIGGERS  [facility] Providers Cardiologist:  Vina Gull, MD     History of Present Illness [name] is a 58 y.o. female with a PMH of chest pain, HTN, history of POTS, hx of former tobacco abuse, and obesity, who presents today for ED follow-up.   Last seen by Dr. Gull on Feb 20, 2022.  At that time, she had been seen in the hospital for chest pain and was ruled out for MI. Symptoms were found to be atypical but concerning per Dr. Gull.  A CT coronary angiogram revealed coronary calcium score of 0 with moderate biatrial enlargement.  ED visit on Feb 27, 2024 - reported chest discomfort x 2 days, squeezing on left side, nonradiating and intermittent.  Noted exertional shortness of breath.  Intermittent, occasional nausea with symptoms.  No aggravating factors. Workup was unremarkable.   03/13/2024 - Today she presents for ED follow-up. She states she continues to note intermittent episodes of similar chest discomfort. Very concerning to her because CVD runs in her family. Also notes left sided jaw/neck pain. Denies any current chest pain, palpitations, syncope, presyncope, dizziness, orthopnea, PND, swelling or significant weight changes, acute bleeding, or claudication. Also notes DOE occasionally.   05/01/2024 - Here for follow-up by herself.  Doing much better and denies any recurrences and chest pain.  She says she is not sure what caused these episodes previously. Denies any chest pain, shortness of breath, palpitations, syncope, presyncope, dizziness, orthopnea, PND, swelling or significant weight changes, acute bleeding, or claudication.  ROS: Negative. See HPI.  SH: Her mother is also a patient of mine.   Studies Reviewed  EKG:   EKG is not ordered today.  Lexiscan  03/2024:    Stress ECG is negative for ischemia and arrhythmias.   LV perfusion is normal. There is  no evidence of ischemia. There is no evidence of infarction.   Left ventricular function is normal. Nuclear stress EF: 73%.   Findings are consistent with no ischemia and no infarction. The study is low risk.  Echo 03/2024:  1. Left ventricular ejection fraction, by estimation, is 60 to 65%. Left  ventricular ejection fraction by 3D volume is 60 %. The left ventricle has  normal function. The left ventricle demonstrates severe hypokinesis of  basal inferior wall. Left  ventricular diastolic parameters are consistent with Grade I diastolic  dysfunction (impaired relaxation). The average left ventricular global  longitudinal strain is -20.3 %. The global longitudinal strain is normal.   2. Right ventricular systolic function is normal. The right ventricular  size is normal.   3. The mitral valve is normal in structure. Trivial mitral valve  regurgitation. No evidence of mitral stenosis.   4. The aortic valve has an indeterminant number of cusps. Aortic valve  regurgitation is not visualized. No aortic stenosis is present.   5. The inferior vena cava is normal in size with greater than 50%  respiratory variability, suggesting right atrial pressure of 3 mmHg.   Comparison(s): A prior study was performed on 07/19/2016. EF 55-60%. Trace MR.   CCTA 02/2022:  IMPRESSION: 1. No evidence of CAD, CADRADS = 0.   2. Coronary calcium score of 0.   3. Normal coronary origins with right dominance.   4. Moderate biatrial enlargement.    Echo 07/2016:  Study Conclusions   - Left ventricle: The cavity size was normal.  Wall thickness was    normal. Systolic function was normal. The estimated ejection    fraction was in the range of 55% to 60%. Wall motion was normal;    there were no regional wall motion abnormalities.   Impressions:   - Normal LV systolic function; probable mild diastolic dysfunction;    trace MR. Risk Assessment/Calculations  The 10-year ASCVD risk score (Arnett DK, et  al., 2019) is: 5.2%   Values used to calculate the score:     Age: 58 years     Clincally relevant sex: Female     Is Non-Hispanic African American: No     Diabetic: No     Tobacco smoker: Yes     Systolic Blood Pressure: 120 mmHg     Is BP treated: Yes     HDL Cholesterol: 48 mg/dL     Total Cholesterol: 139 mg/dL   Physical Exam VS:  BP 120/82 (BP Location: Left Arm, Cuff Size: Large)   Pulse 80   Ht 5' 5 (1.651 m)   Wt 212 lb (96.2 kg)   LMP 09/26/2014   SpO2 100%   BMI 35.28 kg/m    Wt Readings from Last 3 Encounters:  05/01/24 212 lb (96.2 kg)  03/13/24 215 lb (97.5 kg)  02/27/24 217 lb 13 oz (98.8 kg)    GEN: Obese, 58 y.o. female in no acute distress NECK: No JVD; No carotid bruits CARDIAC: S1/S2, RRR, no murmurs, rubs, gallops RESPIRATORY:  Clear to auscultation without rales, wheezing or rhonchi  ABDOMEN: Soft, non-tender, non-distended EXTREMITIES:  No edema; No deformity   ASSESSMENT AND PLAN  1. Hx of POTS Denies any recent issues.  No medication changes at this time.  Continue follow-up with PCP.  3. HTN Blood pressure stable and at goal. Discussed to monitor BP at home at least 2 hours after medications and sitting for 5-10 minutes.  Continue current medication regimen.  Continue follow-up with PCP.  Heart healthy diet recommended.  4. Obesity Weight loss via diet and exercise encouraged. Discussed the impact being overweight would have on cardiovascular risk.   Dispo: Follow-up with MD/APP in 6 months or sooner if any changes.  Signed, Almarie Crate, NP

## 2024-05-01 ENCOUNTER — Ambulatory Visit: Attending: Nurse Practitioner | Admitting: Nurse Practitioner

## 2024-05-01 ENCOUNTER — Encounter: Payer: Self-pay | Admitting: Nurse Practitioner

## 2024-05-01 VITALS — BP 120/82 | HR 80 | Ht 65.0 in | Wt 212.0 lb

## 2024-05-01 DIAGNOSIS — G90A Postural orthostatic tachycardia syndrome (POTS): Secondary | ICD-10-CM

## 2024-05-01 DIAGNOSIS — I10 Essential (primary) hypertension: Secondary | ICD-10-CM | POA: Diagnosis not present

## 2024-05-01 DIAGNOSIS — E669 Obesity, unspecified: Secondary | ICD-10-CM

## 2024-05-01 NOTE — Patient Instructions (Signed)
 Medication Instructions:  Continue all current medications.   Labwork: none  Testing/Procedures: none  Follow-Up: 6 months   Any Other Special Instructions Will Be Listed Below (If Applicable).   If you need a refill on your cardiac medications before your next appointment, please call your pharmacy.

## 2024-06-29 ENCOUNTER — Ambulatory Visit: Admitting: Physician Assistant

## 2024-07-15 ENCOUNTER — Ambulatory Visit: Admitting: Physician Assistant

## 2024-08-17 ENCOUNTER — Ambulatory Visit: Payer: Self-pay | Admitting: Physician Assistant

## 2024-08-17 ENCOUNTER — Encounter: Payer: Self-pay | Admitting: Physician Assistant

## 2024-08-17 VITALS — BP 139/73 | HR 74 | Temp 97.7°F | Ht 65.0 in | Wt 221.0 lb

## 2024-08-17 DIAGNOSIS — I10 Essential (primary) hypertension: Secondary | ICD-10-CM

## 2024-08-17 DIAGNOSIS — E890 Postprocedural hypothyroidism: Secondary | ICD-10-CM

## 2024-08-17 DIAGNOSIS — Z13220 Encounter for screening for lipoid disorders: Secondary | ICD-10-CM | POA: Diagnosis not present

## 2024-08-17 NOTE — Assessment & Plan Note (Signed)
 Stable, denies symptomatology aside from weight gain. Thyroid  functions today. Ensured proper refills.

## 2024-08-17 NOTE — Assessment & Plan Note (Signed)
 139/73 Stable. Continue current medications. No change in management. Discussed DASH diet and dietary sodium restrictions.  Increase dietary efforts and physical activity. Ensured patient has proper refills.

## 2024-08-17 NOTE — Progress Notes (Signed)
 Established Patient Office Visit  Subjective   Patient ID: [name], female    DOB: [birth year]  Age: 58 y.o. MRN: [id number]  No chief complaint on file.  Discussed the use of AI scribe software for clinical note transcription with the patient, who gave verbal consent to proceed.  History of Present Illness [name] is a 58 year old female with a history of thyroidectomy and hypertension who presents for follow-up on her thyroid  medication and blood pressure management.  She takes levothyroxine  in the morning before eating. Her thyroid  levels were slightly off during her last visit, resulting in an increased dose. Her thyroid  hormone levels were normal in March, but TSH was elevated. She is concerned about weight gain, attributing it to her thyroid  condition and decreased activity. Her weight is currently 221 pounds, down from 227 pounds.  She is on medication for hypertension. There have been no recent episodes of chest pain since her last cardiology evaluation, which showed no significant issues.  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 She uses a heating pad for relief. Her knee occasionally catches, requiring her to stop and adjust her movement. 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     Review of Systems  Constitutional:  Negative for chills, fever and malaise/fatigue.  Eyes:  Negative for blurred vision and double vision.  Respiratory:  Negative for cough and shortness of breath.   Cardiovascular:  Negative for chest pain and palpitations.  Musculoskeletal:  Positive for joint pain. Negative for myalgias.  Neurological:  Negative for headaches.  Psychiatric/Behavioral:         Caregiver stress       Objective:     BP 139/73   Pulse 74   Temp 97.7 F (36.5 C)   Ht 5' 5 (1.651 m)   Wt 221 lb (100.2 kg)   LMP 09/26/2014   SpO2 100%   BMI 36.78 kg/m     Physical Exam Constitutional:      General: She is not in acute distress.    Appearance: Normal appearance. She is obese. She is not ill-appearing.  HENT:     Head: Normocephalic and atraumatic.     Mouth/Throat:     Mouth: Mucous membranes are moist.     Pharynx: Oropharynx is clear.  Eyes:     Extraocular Movements: Extraocular movements intact.     Conjunctiva/sclera: Conjunctivae normal.  Cardiovascular:     Rate and Rhythm: Normal rate and regular rhythm.     Heart sounds: Normal heart sounds. No murmur heard. Pulmonary:     Effort: Pulmonary effort is normal.     Breath sounds: Normal breath sounds.  Musculoskeletal:     Right lower leg: No edema.     Left lower leg: No edema.  Skin:    General: Skin is warm and dry.  Neurological:     General: No focal deficit present.     Mental Status: She is alert and oriented to person, place, and time.  Psychiatric:        Mood and Affect: Mood normal.        Behavior: Behavior normal.      No results found for any visits on 08/17/24.  The 10-year ASCVD risk score (Arnett DK, et al., 2019) is: 7%    Assessment & Plan:   Return in about 6 months (around 02/14/2025) for thyroid , sooner as needed.  Postoperative hypothyroidism Assessment & Plan: Stable, denies symptomatology aside from weight gain. Thyroid  functions today. Ensured proper refills.  Orders: -     TSH + free T4  Primary hypertension Assessment & Plan: 139/73 Stable. Continue current medications. No change in management. Discussed DASH diet and dietary sodium restrictions.  Increase dietary efforts and physical activity. Ensured patient has proper refills.    Screening for lipid disorders -     Lipid panel    Nuno Brubacher, PA-C

## 2024-08-18 ENCOUNTER — Ambulatory Visit: Payer: Self-pay | Admitting: Physician Assistant

## 2024-08-18 LAB — LIPID PANEL
Chol/HDL Ratio: 3.4 ratio (ref 0.0–4.4)
Cholesterol, Total: 173 mg/dL (ref 100–199)
HDL: 51 mg/dL (ref >39)
LDL Chol Calc (NIH): 101 mg/dL — ABNORMAL HIGH (ref 0–99)
Triglycerides: 117 mg/dL (ref 0–149)
VLDL Cholesterol Cal: 21 mg/dL (ref 5–40)

## 2024-08-18 LAB — TSH+FREE T4
Free T4: 1.27 ng/dL (ref 0.82–1.77)
TSH: 1.08 u[IU]/mL (ref 0.450–4.500)

## 2024-10-20 ENCOUNTER — Encounter: Payer: Self-pay | Admitting: Physician Assistant

## 2024-10-20 ENCOUNTER — Ambulatory Visit (INDEPENDENT_AMBULATORY_CARE_PROVIDER_SITE_OTHER): Admitting: Physician Assistant

## 2024-10-20 VITALS — BP 114/79 | HR 84 | Temp 97.7°F | Ht 65.0 in | Wt 221.2 lb

## 2024-10-20 DIAGNOSIS — R0989 Other specified symptoms and signs involving the circulatory and respiratory systems: Secondary | ICD-10-CM

## 2024-10-20 DIAGNOSIS — L301 Dyshidrosis [pompholyx]: Secondary | ICD-10-CM

## 2024-10-20 MED ORDER — OSELTAMIVIR PHOSPHATE 75 MG PO CAPS: 75.0000 mg | ORAL_CAPSULE | Freq: Two times a day (BID) | ORAL | 0 refills | Status: AC

## 2024-10-20 NOTE — Assessment & Plan Note (Signed)
 Recent influenza exposure and symptoms consistent with influenza. No high fever. Over-the-counter medications used for symptom relief. She appears well and lungs clear to auscultation bilaterally on exam.  - Prescribed Tamiflu  twice daily for 5 days. - Advised use of over-the-counter Coricidin for cold and flu symptoms. - Provided samples of Mucinex for mucus. - Encouraged hydration with water and Gatorade. - Recommended Tylenol  or ibuprofen for body aches, fever, and headache. - Follow up for worsening symptoms such as high fever, shortness of breath, difficulty breathing or chest pain.

## 2024-10-20 NOTE — Progress Notes (Signed)
 "  Acute Office Visit  Subjective:     Patient ID: [name], female    DOB: December 29, 1965, 59 y.o.   MRN: [id number]   Discussed the use of AI scribe software for clinical note transcription with the patient, who gave verbal consent to proceed.  History of Present Illness [name] is a 59 year old female who presents with flu-like symptoms. For the past two days she has had fatigue and rhinorrhea, followed by headache, myalgias, and worsening fatigue. She notes poor appetite and a waxy sensation in her mouth. She reports intermittent low-grade fever with a maximum of 99.27F. She has been taking an over-the-counter medication her husband uses for heart issues and emergen-C supplements since learning her mother was diagnosed with influenza. She notes swallowing large amounts of mucus but denies any color change. She has sweating at night but no chills.  She also has recurrent small, tender white blisters on her hands. They are not itchy. She has been using multiple topical treatments and is allergic to many substances, including plastics and possibly her work mouse. Eczema is present in her family.    Review of Systems  Constitutional:  Positive for activity change, appetite change, chills, fatigue and fever.  HENT:  Positive for congestion, postnasal drip, rhinorrhea, sneezing and sore throat. Negative for mouth sores and nosebleeds.   Respiratory:  Negative for cough, shortness of breath and wheezing.   Cardiovascular:  Negative for chest pain.  Neurological:  Negative for dizziness, light-headedness and headaches.        Objective:     BP 114/79   Pulse 84   Temp 97.7 F (36.5 C)   Ht 5' 5 (1.651 m)   Wt 221 lb 3.2 oz (100.3 kg)   LMP 09/26/2014   SpO2 99%   BMI 36.81 kg/m   Physical Exam Constitutional:      General: She is not in acute distress.    Appearance: Normal appearance. She is ill-appearing.  HENT:     Head: Normocephalic and atraumatic.     Nose:  Congestion present.     Mouth/Throat:     Mouth: Mucous membranes are moist.     Pharynx: Oropharynx is clear. Posterior oropharyngeal erythema present.  Eyes:     Extraocular Movements: Extraocular movements intact.     Conjunctiva/sclera: Conjunctivae normal.  Cardiovascular:     Rate and Rhythm: Normal rate and regular rhythm.     Heart sounds: Normal heart sounds. No murmur heard. Pulmonary:     Effort: Pulmonary effort is normal.     Breath sounds: Normal breath sounds. No wheezing, rhonchi or rales.  Musculoskeletal:     Right lower leg: No edema.     Left lower leg: No edema.  Skin:    General: Skin is warm.  Neurological:     General: No focal deficit present.     Mental Status: She is alert and oriented to person, place, and time.  Psychiatric:        Mood and Affect: Mood normal.        Behavior: Behavior normal.     No results found for any visits on 10/20/24.      Assessment & Plan:  Suspected novel influenza A virus infection Assessment & Plan: Recent influenza exposure and symptoms consistent with influenza. No high fever. Over-the-counter medications used for symptom relief. She appears well and lungs clear to auscultation bilaterally on exam.  - Prescribed Tamiflu  twice daily for 5 days. - Advised  use of over-the-counter Coricidin for cold and flu symptoms. - Provided samples of Mucinex for mucus. - Encouraged hydration with water and Gatorade. - Recommended Tylenol  or ibuprofen for body aches, fever, and headache. - Follow up for worsening symptoms such as high fever, shortness of breath, difficulty breathing or chest pain.   Orders: -     Oseltamivir  Phosphate; Take 1 capsule (75 mg total) by mouth 2 (two) times daily for 5 days.  Dispense: 10 capsule; Refill: 0  Dyshidrotic eczema Assessment & Plan: Chronic rash on bilateral palms, similar to dyshidrotic eczema with white blisters, tender but not painful or itchy. Possible contact dermatitis due to  allergies. Family history noted. - Recommended over-the-counter hydrocortisone  cream twice daily for two weeks. - Advised moisturizing hands regularly. - Instructed to return if condition worsens or does not improve.     No follow-ups on file.  Nathalee Smarr, PA-C  "

## 2024-10-20 NOTE — Assessment & Plan Note (Signed)
 Chronic rash on bilateral palms, similar to dyshidrotic eczema with white blisters, tender but not painful or itchy. Possible contact dermatitis due to allergies. Family history noted. - Recommended over-the-counter hydrocortisone  cream twice daily for two weeks. - Advised moisturizing hands regularly. - Instructed to return if condition worsens or does not improve.

## 2024-11-02 ENCOUNTER — Ambulatory Visit: Attending: Nurse Practitioner | Admitting: Nurse Practitioner

## 2024-11-02 ENCOUNTER — Encounter: Payer: Self-pay | Admitting: Nurse Practitioner

## 2024-11-02 VITALS — BP 138/70 | HR 89 | Ht 65.0 in | Wt 225.0 lb

## 2024-11-02 DIAGNOSIS — E669 Obesity, unspecified: Secondary | ICD-10-CM | POA: Diagnosis not present

## 2024-11-02 DIAGNOSIS — G90A Postural orthostatic tachycardia syndrome (POTS): Secondary | ICD-10-CM

## 2024-11-02 DIAGNOSIS — I10 Essential (primary) hypertension: Secondary | ICD-10-CM

## 2024-11-02 NOTE — Progress Notes (Signed)
 - Cardiology Office Note   Date:  11/02/2024 ID:  [name], DOB 1966/07/09, MRN [id number] PCP: Grooms, Charmaine RIGGERS  [facility] Providers Cardiologist:  Vina Gull, MD     History of Present Illness [name] is a 59 y.o. female with a PMH of chest pain, HTN, history of POTS, hx of former tobacco abuse, and obesity, who presents today for scheduled follow-up.   Last seen by Dr. Gull on Feb 20, 2022.  At that time, she had been seen in the hospital for chest pain and was ruled out for MI. Symptoms were found to be atypical but concerning per Dr. Gull.  A CT coronary angiogram revealed coronary calcium score of 0 with moderate biatrial enlargement.  ED visit on Feb 27, 2024 - reported chest discomfort x 2 days, squeezing on left side, nonradiating and intermittent.  Noted exertional shortness of breath.  Intermittent, occasional nausea with symptoms.  No aggravating factors. Workup was unremarkable.   03/13/2024 - Today she presents for ED follow-up. She states she continues to note intermittent episodes of similar chest discomfort. Very concerning to her because CVD runs in her family. Also notes left sided jaw/neck pain. Denies any current chest pain, palpitations, syncope, presyncope, dizziness, orthopnea, PND, swelling or significant weight changes, acute bleeding, or claudication. Also notes DOE occasionally.   05/01/2024 - Here for follow-up by herself.  Doing much better and denies any recurrences and chest pain.  She says she is not sure what caused these episodes previously. Denies any chest pain, shortness of breath, palpitations, syncope, presyncope, dizziness, orthopnea, PND, swelling or significant weight changes, acute bleeding, or claudication.  11/02/2024 - Here for follow-up. Doing well and without acute cardiac complaints or issues. Denies any chest pain, shortness of breath, palpitations, syncope, presyncope, dizziness, orthopnea, PND, swelling or significant weight  changes, acute bleeding, or claudication.  ROS: Negative. See HPI.  SH: Her mother is also a patient of mine.   Studies Reviewed  EKG:   EKG is not ordered today.  Lexiscan  03/2024:    Stress ECG is negative for ischemia and arrhythmias.   LV perfusion is normal. There is no evidence of ischemia. There is no evidence of infarction.   Left ventricular function is normal. Nuclear stress EF: 73%.   Findings are consistent with no ischemia and no infarction. The study is low risk.  Echo 03/2024:  1. Left ventricular ejection fraction, by estimation, is 60 to 65%. Left  ventricular ejection fraction by 3D volume is 60 %. The left ventricle has  normal function. The left ventricle demonstrates severe hypokinesis of  basal inferior wall. Left  ventricular diastolic parameters are consistent with Grade I diastolic  dysfunction (impaired relaxation). The average left ventricular global  longitudinal strain is -20.3 %. The global longitudinal strain is normal.   2. Right ventricular systolic function is normal. The right ventricular  size is normal.   3. The mitral valve is normal in structure. Trivial mitral valve  regurgitation. No evidence of mitral stenosis.   4. The aortic valve has an indeterminant number of cusps. Aortic valve  regurgitation is not visualized. No aortic stenosis is present.   5. The inferior vena cava is normal in size with greater than 50%  respiratory variability, suggesting right atrial pressure of 3 mmHg.   Comparison(s): A prior study was performed on 07/19/2016. EF 55-60%. Trace MR.   CCTA 02/2022:  IMPRESSION: 1. No evidence of CAD, CADRADS = 0.   2. Coronary  calcium score of 0.   3. Normal coronary origins with right dominance.   4. Moderate biatrial enlargement.    Echo 07/2016:  Study Conclusions   - Left ventricle: The cavity size was normal. Wall thickness was    normal. Systolic function was normal. The estimated ejection    fraction was in the  range of 55% to 60%. Wall motion was normal;    there were no regional wall motion abnormalities.   Impressions:   - Normal LV systolic function; probable mild diastolic dysfunction;    trace MR. Risk Assessment/Calculations  The 10-year ASCVD risk score (Arnett DK, et al., 2019) is: 3.5%   Values used to calculate the score:     Age: 48 years     Clinically relevant sex: Female     Is Non-Hispanic African American: No     Diabetic: No     Tobacco smoker: No     Systolic Blood Pressure: 138 mmHg     Is BP treated: Yes     HDL Cholesterol: 51 mg/dL     Total Cholesterol: 173 mg/dL   Physical Exam VS:  BP 138/70 (BP Location: Left Arm)   Pulse 89   Ht 5' 5 (1.651 m)   Wt 225 lb (102.1 kg)   LMP 09/26/2014   SpO2 98%   BMI 37.44 kg/m    Wt Readings from Last 3 Encounters:  11/02/24 225 lb (102.1 kg)  10/20/24 221 lb 3.2 oz (100.3 kg)  08/17/24 221 lb (100.2 kg)    GEN: Obese, 59 y.o. female in no acute distress NECK: No JVD; No carotid bruits CARDIAC: S1/S2, RRR, no murmurs, rubs, gallops RESPIRATORY:  Clear to auscultation without rales, wheezing or rhonchi  ABDOMEN: Soft, non-tender, non-distended EXTREMITIES:  No edema; No deformity   ASSESSMENT AND PLAN  1. Hx of POTS Denies any recent issues.  No medication changes at this time.  Continue follow-up with PCP.  3. HTN Blood pressure stable, BP well controlled at home. Discussed to monitor BP at home at least 2 hours after medications and sitting for 5-10 minutes.  Continue current medication regimen.  Continue follow-up with PCP.  Heart healthy diet recommended.  4. Obesity Weight loss via diet and exercise encouraged. Discussed the impact being overweight would have on cardiovascular risk.  I spent a total duration of 20 minutes reviewing prior notes, reviewing outside records including  labs, face-to-face counseling of medical condition, pathophysiology, evaluation, management, and documenting the findings in  the note.    Dispo: Follow-up with MD/APP in 6 months or sooner if any changes.  Signed, Almarie Crate, NP

## 2024-11-02 NOTE — Patient Instructions (Signed)
